# Patient Record
Sex: Female | Born: 1995 | Race: Black or African American | Hispanic: No | Marital: Single | Smoking: Former smoker
Health system: Southern US, Community
[De-identification: ages and names within clinical notes are randomized; demographics above are authoritative.]

## PROBLEM LIST (undated history)

## (undated) DIAGNOSIS — F909 Attention-deficit hyperactivity disorder, unspecified type: Secondary | ICD-10-CM

## (undated) DIAGNOSIS — F32A Depression, unspecified: Secondary | ICD-10-CM

---
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
## 2016-03-31 ENCOUNTER — Emergency Department
Admission: EM | Admit: 2016-03-31 | Discharge: 2016-03-31 | Disposition: A | Discharge status: Home / Self Care | Payer: Self-pay | Attending: Emergency Medicine | Admitting: Emergency Medicine

## 2016-03-31 ENCOUNTER — Emergency Department: Payer: Self-pay

## 2016-03-31 ENCOUNTER — Encounter: Payer: Self-pay | Admitting: *Deleted

## 2016-03-31 DIAGNOSIS — B3731 Acute candidiasis of vulva and vagina: Secondary | ICD-10-CM

## 2016-03-31 DIAGNOSIS — R1030 Lower abdominal pain, unspecified: Secondary | ICD-10-CM

## 2016-03-31 DIAGNOSIS — B379 Candidiasis, unspecified: Secondary | ICD-10-CM

## 2016-03-31 DIAGNOSIS — F17210 Nicotine dependence, cigarettes, uncomplicated: Secondary | ICD-10-CM | POA: Insufficient documentation

## 2016-03-31 DIAGNOSIS — B373 Candidiasis of vulva and vagina: Secondary | ICD-10-CM | POA: Insufficient documentation

## 2016-03-31 LAB — CBC
HEMATOCRIT: 39.6 % (ref 35.0–47.0)
HEMOGLOBIN: 12.3 g/dL (ref 12.0–16.0)
MCH: 23.5 pg — AB (ref 26.0–34.0)
MCHC: 31.2 g/dL — AB (ref 32.0–36.0)
MCV: 75.4 fL — AB (ref 80.0–100.0)
Platelets: 363 10*3/uL (ref 150–440)
RBC: 5.25 MIL/uL — ABNORMAL HIGH (ref 3.80–5.20)
RDW: 16.1 % — ABNORMAL HIGH (ref 11.5–14.5)
WBC: 10 10*3/uL (ref 3.6–11.0)

## 2016-03-31 LAB — WET PREP, GENITAL
CLUE CELLS WET PREP: NONE SEEN
Sperm: NONE SEEN
TRICH WET PREP: NONE SEEN

## 2016-03-31 LAB — URINALYSIS COMPLETE WITH MICROSCOPIC (ARMC ONLY)
BACTERIA UA: NONE SEEN
Bilirubin Urine: NEGATIVE
Glucose, UA: NEGATIVE mg/dL
Hgb urine dipstick: NEGATIVE
Ketones, ur: NEGATIVE mg/dL
Nitrite: NEGATIVE
PROTEIN: 30 mg/dL — AB
Specific Gravity, Urine: 1.028 (ref 1.005–1.030)
pH: 5 (ref 5.0–8.0)

## 2016-03-31 LAB — COMPREHENSIVE METABOLIC PANEL
ALBUMIN: 4.7 g/dL (ref 3.5–5.0)
ALK PHOS: 77 U/L (ref 38–126)
ALT: 15 U/L (ref 14–54)
ANION GAP: 10 (ref 5–15)
AST: 24 U/L (ref 15–41)
BUN: 8 mg/dL (ref 6–20)
CALCIUM: 9.6 mg/dL (ref 8.9–10.3)
CO2: 21 mmol/L — AB (ref 22–32)
Chloride: 109 mmol/L (ref 101–111)
Creatinine, Ser: 0.73 mg/dL (ref 0.44–1.00)
GFR calc Af Amer: >60 mL/min (ref >60)
GFR calc non Af Amer: >60 mL/min (ref >60)
GLUCOSE: 125 mg/dL — AB (ref 65–99)
POTASSIUM: 3.8 mmol/L (ref 3.5–5.1)
SODIUM: 140 mmol/L (ref 135–145)
Total Bilirubin: 0.3 mg/dL (ref 0.3–1.2)
Total Protein: 8.4 g/dL — ABNORMAL HIGH (ref 6.5–8.1)

## 2016-03-31 LAB — CHLAMYDIA/NGC RT PCR (ARMC ONLY)
CHLAMYDIA TR: NOT DETECTED
N gonorrhoeae: NOT DETECTED

## 2016-03-31 LAB — PREGNANCY, URINE: PREG TEST UR: NEGATIVE

## 2016-03-31 LAB — LIPASE, BLOOD: Lipase: 16 U/L (ref 11–51)

## 2016-03-31 MED ORDER — LOPERAMIDE HCL 2 MG PO CAPS
2.0000 mg | ORAL_CAPSULE | Freq: Once | ORAL | Status: AC
  Administered 2016-03-31: 2 mg via ORAL
  Filled 2016-03-31: qty 1

## 2016-03-31 MED ORDER — FLUCONAZOLE 50 MG PO TABS
150.0000 mg | ORAL_TABLET | Freq: Once | ORAL | Status: AC
  Administered 2016-03-31: 150 mg via ORAL
  Filled 2016-03-31: qty 1

## 2016-03-31 MED ORDER — SODIUM CHLORIDE 0.9 % IV BOLUS (SEPSIS)
1000.0000 mL | Freq: Once | INTRAVENOUS | Status: AC
  Administered 2016-03-31: 1000 mL via INTRAVENOUS

## 2016-03-31 MED ORDER — ONDANSETRON HCL 4 MG/2ML IJ SOLN
4.0000 mg | Freq: Once | INTRAMUSCULAR | Status: AC
  Administered 2016-03-31: 4 mg via INTRAVENOUS
  Filled 2016-03-31: qty 2
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
## 2016-03-31 NOTE — Discharge Instructions (Signed)
Abdominal Pain, Adult Many things can cause abdominal pain. Usually, abdominal pain is not caused by a disease and will improve without treatment. It can often be observed and treated at home. Your health care provider will do a physical exam and possibly order blood tests and X-rays to help determine the seriousness of your pain. However, in many cases, more time must pass before a clear cause of the pain can be found. Before that point, your health care provider may not know if you need more testing or further treatment. HOME CARE INSTRUCTIONS Monitor your abdominal pain for any changes. The following actions may help to alleviate any discomfort you are experiencing:  Only take over-the-counter or prescription medicines as directed by your health care provider.  Do not take laxatives unless directed to do so by your health care provider.  Try a clear liquid diet (broth, tea, or water) as directed by your health care provider. Slowly move to a bland diet as tolerated. SEEK MEDICAL CARE IF:  You have unexplained abdominal pain.  You have abdominal pain associated with nausea or diarrhea.  You have pain when you urinate or have a bowel movement.  You experience abdominal pain that wakes you in the night.  You have abdominal pain that is worsened or improved by eating food.  You have abdominal pain that is worsened with eating fatty foods.  You have a fever. SEEK IMMEDIATE MEDICAL CARE IF:  Your pain does not go away within 2 hours.  You keep throwing up (vomiting).  Your pain is felt only in portions of the abdomen, such as the right side or the left lower portion of the abdomen.  You pass bloody or black tarry stools. MAKE SURE YOU:  Understand these instructions.  Will watch your condition.  Will get help right away if you are not doing well or get worse.   This information is not intended to replace advice given to you by your health care provider. Make sure you discuss  any questions you have with your health care provider.   Document Released: 07/11/2005 Document Revised: 06/22/2015 Document Reviewed: 06/10/2013 Elsevier Interactive Patient Education 2016 Elsevier Inc.  Monilial Vaginitis Vaginitis in a soreness, swelling and redness (inflammation) of the vagina and vulva. Monilial vaginitis is not a sexually transmitted infection. CAUSES  Yeast vaginitis is caused by yeast (candida) that is normally found in your vagina. With a yeast infection, the candida has overgrown in number to a point that upsets the chemical balance. SYMPTOMS   White, thick vaginal discharge.  Swelling, itching, redness and irritation of the vagina and possibly the lips of the vagina (vulva).  Burning or painful urination.  Painful intercourse. DIAGNOSIS  Things that may contribute to monilial vaginitis are:  Postmenopausal and virginal states.  Pregnancy.  Infections.  Being tired, sick or stressed, especially if you had monilial vaginitis in the past.  Diabetes. Good control will help lower the chance.  Birth control pills.  Tight fitting garments.  Using bubble bath, feminine sprays, douches or deodorant tampons.  Taking certain medications that kill germs (antibiotics).  Sporadic recurrence can occur if you become ill. TREATMENT  Your caregiver will give you medication.  There are several kinds of anti monilial vaginal creams and suppositories specific for monilial vaginitis. For recurrent yeast infections, use a suppository or cream in the vagina 2 times a week, or as directed.  Anti-monilial or steroid cream for the itching or irritation of the vulva may also be used. Get  your caregiver's permission. °· Painting the vagina with methylene blue solution may help if the monilial cream does not work. °· Eating yogurt may help prevent monilial vaginitis. °HOME CARE INSTRUCTIONS  °· Finish all medication as prescribed. °· Do not have sex until treatment is  completed or after your caregiver tells you it is okay. °· Take warm sitz baths. °· Do not douche. °· Do not use tampons, especially scented ones. °· Wear cotton underwear. °· Avoid tight pants and panty hose. °· Tell your sexual partner that you have a yeast infection. They should go to their caregiver if they have symptoms such as mild rash or itching. °· Your sexual partner should be treated as well if your infection is difficult to eliminate. °· Practice safer sex. Use condoms. °· Some vaginal medications cause latex condoms to fail. Vaginal medications that harm condoms are: °¨ Cleocin cream. °¨ Butoconazole (Femstat®). °¨ Terconazole (Terazol®) vaginal suppository. °¨ Miconazole (Monistat®) (may be purchased over the counter). °SEEK MEDICAL CARE IF:  °· You have a temperature by mouth above 102° F (38.9° C). °· The infection is getting worse after 2 days of treatment. °· The infection is not getting better after 3 days of treatment. °· You develop blisters in or around your vagina. °· You develop vaginal bleeding, and it is not your menstrual period. °· You have pain when you urinate. °· You develop intestinal problems. °· You have pain with sexual intercourse. °  °This information is not intended to replace advice given to you by your health care provider. Make sure you discuss any questions you have with your health care provider. °  °Document Released: 07/11/2005 Document Revised: 12/24/2011 Document Reviewed: 04/04/2015 °Elsevier Interactive Patient Education ©2016 Elsevier Inc. ° °
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
## 2017-04-06 ENCOUNTER — Emergency Department (HOSPITAL_COMMUNITY)
Admission: EM | Admit: 2017-04-06 | Discharge: 2017-04-06 | Disposition: A | Discharge status: Home / Self Care | Payer: Self-pay

## 2017-04-06 NOTE — ED Triage Notes (Signed)
No answer in waiting.  

## 2017-04-06 NOTE — ED Triage Notes (Signed)
No answer x3

## 2017-04-07 ENCOUNTER — Emergency Department (HOSPITAL_COMMUNITY): Payer: Self-pay

## 2017-04-07 ENCOUNTER — Encounter (HOSPITAL_COMMUNITY): Payer: Self-pay | Admitting: *Deleted

## 2017-04-07 ENCOUNTER — Emergency Department (HOSPITAL_COMMUNITY)
Admission: EM | Admit: 2017-04-07 | Discharge: 2017-04-07 | Disposition: A | Discharge status: Home / Self Care | Payer: Self-pay | Attending: Emergency Medicine | Admitting: Emergency Medicine

## 2017-04-07 DIAGNOSIS — J069 Acute upper respiratory infection, unspecified: Secondary | ICD-10-CM | POA: Insufficient documentation

## 2017-04-07 DIAGNOSIS — F17210 Nicotine dependence, cigarettes, uncomplicated: Secondary | ICD-10-CM | POA: Insufficient documentation

## 2017-04-07 DIAGNOSIS — R1110 Vomiting, unspecified: Secondary | ICD-10-CM | POA: Insufficient documentation

## 2017-04-07 LAB — RAPID STREP SCREEN (MED CTR MEBANE ONLY): STREPTOCOCCUS, GROUP A SCREEN (DIRECT): NEGATIVE

## 2017-04-07 MED ORDER — GUAIFENESIN 100 MG/5ML PO SOLN
5.0000 mL | Freq: Once | ORAL | Status: AC
  Administered 2017-04-07: 100 mg via ORAL
  Filled 2017-04-07: qty 5

## 2017-04-07 MED ORDER — BENZONATATE 100 MG PO CAPS: 100.0000 mg | ORAL_CAPSULE | Freq: Three times a day (TID) | ORAL | 0 refills | Status: DC

## 2017-04-07 NOTE — ED Provider Notes (Signed)
[facility]-EMERGENCY DEPT Provider Note   CSN: [id number] Arrival date & time: 04/07/17  1351     History   Chief Complaint Chief Complaint  Patient presents with  . URI  . Emesis    HPI Susan Graves is a 21 y.o. female.  Patient is a 21 year old female with no cerumen past medical history who presents with cough and cold symptoms. She states for the last 3 days she's had runny nose congestion and coughing. She's coughing up some yellow-green sputum. She denies any shortness of breath. She has a little bit of pain in the center of her chest only with coughing. She's had some posttussive emesis but no other vomiting. She also has a sore throat. No known fevers but she has had some chills. She also has myalgias and a bifrontal-type headache. She's been using Alka-Seltzer and Tylenol without significant improvement in symptoms.      History reviewed. No pertinent past medical history.  There are no active problems to display for this patient.   History reviewed. No pertinent surgical history.  OB History    No data available       Home Medications    Prior to Admission medications   Medication Sig Start Date End Date Taking? Authorizing Provider  acetaminophen (TYLENOL) 650 MG CR tablet Take 650 mg by mouth every 8 (eight) hours as needed for pain or fever.   Yes [provider]  benzonatate (TESSALON) 100 MG capsule Take 1 capsule (100 mg total) by mouth every 8 (eight) hours. 04/07/17   Rolan Bucco, MD    Family History No family history on file.  Social History Social History  Substance Use Topics  . Smoking status: Current Every Day Smoker    Packs/day: 0.50    Types: Cigarettes  . Smokeless tobacco: Never Used  . Alcohol use No     Allergies   Patient has no known allergies.   Review of Systems Review of Systems  Constitutional: Positive for appetite change, chills and fatigue. Negative for diaphoresis and fever.  HENT: Positive for  congestion, postnasal drip, rhinorrhea and sore throat. Negative for sneezing, trouble swallowing and voice change.   Eyes: Negative.   Respiratory: Positive for cough. Negative for chest tightness and shortness of breath.   Cardiovascular: Positive for chest pain. Negative for leg swelling.  Gastrointestinal: Positive for vomiting. Negative for abdominal pain, blood in stool, diarrhea and nausea.  Genitourinary: Negative for difficulty urinating, flank pain, frequency and hematuria.  Musculoskeletal: Positive for myalgias. Negative for arthralgias and back pain.  Skin: Negative for rash.  Neurological: Positive for headaches. Negative for dizziness, speech difficulty, weakness and numbness.     Physical Exam Updated Vital Signs BP 104/66   Pulse 95   Temp 98.9 F (37.2 C) (Oral)   Resp 18   Ht 5\' 6"  (1.676 m)   Wt 67.6 kg (149 lb)   LMP 03/15/2017   SpO2 100%   BMI 24.05 kg/m   Physical Exam  Constitutional: She is oriented to person, place, and time. She appears well-developed and well-nourished.  HENT:  Head: Normocephalic and atraumatic.  Right Ear: External ear normal.  Left Ear: External ear normal.  Mouth/Throat: Oropharynx is clear and moist. No oropharyngeal exudate.  Eyes: Pupils are equal, round, and reactive to light.  Neck: Normal range of motion. Neck supple.  No meningismus  Cardiovascular: Normal rate, regular rhythm and normal heart sounds.   Pulmonary/Chest: Effort normal and breath sounds normal. No respiratory distress.  She has no wheezes. She has no rales. She exhibits no tenderness.  Abdominal: Soft. Bowel sounds are normal. There is no tenderness. There is no rebound and no guarding.  Musculoskeletal: Normal range of motion. She exhibits no edema.  Lymphadenopathy:    She has no cervical adenopathy.  Neurological: She is alert and oriented to person, place, and time.  Skin: Skin is warm and dry. No rash noted.  Psychiatric: She has a normal mood and  affect.     ED Treatments / Results  Labs (all labs ordered are listed, but only abnormal results are displayed) Labs Reviewed  RAPID STREP SCREEN (NOT AT [facility])  CULTURE, GROUP A STREP [facility])    EKG  EKG Interpretation None       Radiology Dg Chest 2 View  Result Date: 04/07/2017 CLINICAL DATA:  Pt c/o fever, body aches, SOB, sore throat, headaches, tightness in her chest and a productive cough of green/yellow mucus x 3 days. EXAM: CHEST  2 VIEW COMPARISON:  03/04/2009 FINDINGS: The heart size and mediastinal contours are within normal limits. Both lungs are clear. No pleural effusion or pneumothorax. The visualized skeletal structures are unremarkable. IMPRESSION: Normal chest radiographs. Electronically Signed   By: Amie Portlandavid  Ormond M.D.   On: 04/07/2017 16:54    Procedures Procedures (including critical care time)  Medications Ordered in ED Medications  guaiFENesin (ROBITUSSIN) 100 MG/5ML solution 100 mg (100 mg Oral Given 04/07/17 1625)     Initial Impression / Assessment and Plan / ED Course  I have reviewed the triage vital signs and the nursing notes.  Pertinent labs & imaging results that were available during my care of the patient were reviewed by me and considered in my medical decision making (see chart for details).     Patient presents with URI symptoms. Her strep test is negative. Her chest x-ray is clear without evidence of pneumonia. She's otherwise well-appearing with no hypoxia. She was discharged home in good condition. She was given a prescription for Tessalon pearls. She was also advised to use Mucinex and ibuprofen for symptomatic relief Return precautions were given.  Final Clinical Impressions(s) / ED Diagnoses   Final diagnoses:  Viral upper respiratory tract infection    New Prescriptions New Prescriptions   BENZONATATE (TESSALON) 100 MG CAPSULE    Take 1 capsule (100 mg total) by mouth every 8 (eight) hours.     Rolan BuccoBelfi, Jadyn Barge,  MD 04/07/17 [phone]

## 2017-04-07 NOTE — ED Triage Notes (Signed)
PT states sore throat, rhinitis, chest pain with cough and emesis x 5.

## 2017-04-08 MED FILL — BENZONATATE 100 MG CAP: 100 | 7 days supply | Qty: 21 | Fill #0

## 2017-04-09 LAB — CULTURE, GROUP A STREP (THRC)

## 2017-04-11 ENCOUNTER — Emergency Department (HOSPITAL_COMMUNITY)
Admission: EM | Admit: 2017-04-11 | Discharge: 2017-04-11 | Disposition: A | Discharge status: Home / Self Care | Payer: Self-pay | Attending: Emergency Medicine | Admitting: Emergency Medicine

## 2017-04-11 ENCOUNTER — Encounter (HOSPITAL_COMMUNITY): Payer: Self-pay

## 2017-04-11 DIAGNOSIS — B001 Herpesviral vesicular dermatitis: Secondary | ICD-10-CM | POA: Insufficient documentation

## 2017-04-11 DIAGNOSIS — F17210 Nicotine dependence, cigarettes, uncomplicated: Secondary | ICD-10-CM | POA: Insufficient documentation

## 2017-04-11 DIAGNOSIS — Z79899 Other long term (current) drug therapy: Secondary | ICD-10-CM | POA: Insufficient documentation

## 2017-04-11 MED ORDER — ACYCLOVIR 400 MG PO TABS: 400.0000 mg | ORAL_TABLET | Freq: Three times a day (TID) | ORAL | 0 refills | Status: DC

## 2017-04-11 NOTE — Discharge Instructions (Signed)
Read the information below.  Use the prescribed medication as directed.  Please discuss all new medications with your pharmacist.  You may return to the Emergency Department at any time for worsening condition or any new symptoms that concern you.    °
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
## 2017-04-11 NOTE — ED Triage Notes (Addendum)
Pt states that partner was dx'd with herpes today and she wants to know if she has it as well. She reports cold symptoms, but no other issues. A&Ox4. Denies vaginal d/c, bumps or pain. Ambulatory.

## 2017-04-11 NOTE — Congregational Nurse Program (Signed)
Congregational Nurse Program Note  Date of Encounter: 04/08/2017  Past Medical History: No past medical history on file.  Encounter Details:     CNP Questionnaire - 04/08/17 1620      Patient Demographics   Is this a new or existing patient? New   Patient is considered a/an Not Applicable   Race American Indian/[place] Native     Patient Assistance   Location of Patient Assistance Not Applicable   Patient's financial/insurance status Low Income;Self-Pay (Uninsured)   Patient referred to apply for the following financial assistance Not Applicable   Food insecurities addressed Not Applicable   Transportation assistance No   Assistance securing medications Yes   Type of Assistance [facility]   Educational health offerings Acute disease;Medications     Encounter Details   Primary purpose of visit Acute Illness/Condition Visit;Post ED/Hospitalization Visit   Was an Emergency Department visit averted? Not Applicable   Does patient have a medical provider? No   Patient referred to Clinic   Was a mental health screening completed? (GAINS tool) No   Does patient have dental issues? No   Does patient have vision issues? No   Does your patient have an abnormal blood pressure today? No   Since previous encounter, have you referred patient for abnormal blood pressure that resulted in a new diagnosis or medication change? No   Does your patient have an abnormal blood glucose today? No   Since previous encounter, have you referred patient for abnormal blood glucose that resulted in a new diagnosis or medication change? No   Was there a life-saving intervention made? No     Requested assistance with obtaining medication for cough filled (script written in the ED).  Medication filled and given to client

## 2017-04-12 LAB — HSV 2 ANTIBODY, IGG

## 2017-04-12 LAB — HSV 1 ANTIBODY, IGG: HSV 1 GLYCOPROTEIN G AB, IGG: 61.7 {index} — AB (ref 0.00–0.90)

## 2017-04-15 ENCOUNTER — Encounter: Payer: Self-pay | Admitting: Pediatric Intensive Care

## 2017-04-15 MED FILL — ACYCLOVIR 400 MG TABLET: 400 | 7 days supply | Qty: 21 | Fill #0

## 2017-05-22 ENCOUNTER — Encounter: Payer: Self-pay | Admitting: Pediatric Intensive Care

## 2017-05-22 NOTE — Congregational Nurse Program (Signed)
Congregational Nurse Program Note  Date of Encounter: 04/15/2017  Past Medical History: No past medical history on file.  Encounter Details: Client has prescription for zovirax she needs filled. CN [facility]. CN advised client to [facility] post-ED visit.

## 2017-05-26 ENCOUNTER — Emergency Department
Admission: EM | Admit: 2017-05-26 | Discharge: 2017-05-26 | Disposition: A | Discharge status: Home / Self Care | Payer: Self-pay | Attending: Emergency Medicine | Admitting: Emergency Medicine

## 2017-05-26 ENCOUNTER — Encounter: Payer: Self-pay | Admitting: Emergency Medicine

## 2017-05-26 DIAGNOSIS — Z23 Encounter for immunization: Secondary | ICD-10-CM | POA: Insufficient documentation

## 2017-05-26 DIAGNOSIS — Y998 Other external cause status: Secondary | ICD-10-CM | POA: Insufficient documentation

## 2017-05-26 DIAGNOSIS — F329 Major depressive disorder, single episode, unspecified: Secondary | ICD-10-CM | POA: Insufficient documentation

## 2017-05-26 DIAGNOSIS — F17210 Nicotine dependence, cigarettes, uncomplicated: Secondary | ICD-10-CM | POA: Insufficient documentation

## 2017-05-26 DIAGNOSIS — F32A Depression, unspecified: Secondary | ICD-10-CM

## 2017-05-26 DIAGNOSIS — Y9389 Activity, other specified: Secondary | ICD-10-CM | POA: Insufficient documentation

## 2017-05-26 DIAGNOSIS — X788XXA Intentional self-harm by other sharp object, initial encounter: Secondary | ICD-10-CM | POA: Insufficient documentation

## 2017-05-26 DIAGNOSIS — Y929 Unspecified place or not applicable: Secondary | ICD-10-CM | POA: Insufficient documentation

## 2017-05-26 DIAGNOSIS — S41112A Laceration without foreign body of left upper arm, initial encounter: Secondary | ICD-10-CM | POA: Insufficient documentation

## 2017-05-26 HISTORY — DX: Attention-deficit hyperactivity disorder, unspecified type: F90.9

## 2017-05-26 LAB — CBC WITH DIFFERENTIAL/PLATELET
Basophils Absolute: 0.1 10*3/uL (ref 0–0.1)
Basophils Relative: 1 %
Eosinophils Absolute: 0 10*3/uL (ref 0–0.7)
Eosinophils Relative: 0 %
HCT: 33.7 % — ABNORMAL LOW (ref 35.0–47.0)
HEMOGLOBIN: 10.8 g/dL — AB (ref 12.0–16.0)
LYMPHS ABS: 1.2 10*3/uL (ref 1.0–3.6)
LYMPHS PCT: 20 %
MCH: 21.8 pg — AB (ref 26.0–34.0)
MCHC: 32.1 g/dL (ref 32.0–36.0)
MCV: 67.7 fL — AB (ref 80.0–100.0)
MONOS PCT: 8 %
Monocytes Absolute: 0.5 10*3/uL (ref 0.2–0.9)
NEUTROS PCT: 71 %
Neutro Abs: 4.5 10*3/uL (ref 1.4–6.5)
PLATELETS: 331 10*3/uL (ref 150–440)
RBC: 4.97 MIL/uL (ref 3.80–5.20)
RDW: 17.5 % — ABNORMAL HIGH (ref 11.5–14.5)
WBC: 6.3 10*3/uL (ref 3.6–11.0)

## 2017-05-26 LAB — COMPREHENSIVE METABOLIC PANEL
ALT: 12 U/L — AB (ref 14–54)
AST: 22 U/L (ref 15–41)
Albumin: 4.9 g/dL (ref 3.5–5.0)
Alkaline Phosphatase: 73 U/L (ref 38–126)
Anion gap: 9 (ref 5–15)
BUN: 11 mg/dL (ref 6–20)
CHLORIDE: 109 mmol/L (ref 101–111)
CO2: 21 mmol/L — AB (ref 22–32)
CREATININE: 0.84 mg/dL (ref 0.44–1.00)
Calcium: 9.6 mg/dL (ref 8.9–10.3)
GFR calc Af Amer: >60 mL/min (ref >60)
Glucose, Bld: 104 mg/dL — ABNORMAL HIGH (ref 65–99)
POTASSIUM: 3.7 mmol/L (ref 3.5–5.1)
SODIUM: 139 mmol/L (ref 135–145)
Total Bilirubin: 0.4 mg/dL (ref 0.3–1.2)
Total Protein: 8.2 g/dL — ABNORMAL HIGH (ref 6.5–8.1)

## 2017-05-26 LAB — URINE DRUG SCREEN, QUALITATIVE (ARMC ONLY)
AMPHETAMINES, UR SCREEN: NOT DETECTED
Barbiturates, Ur Screen: NOT DETECTED
Benzodiazepine, Ur Scrn: NOT DETECTED
CANNABINOID 50 NG, UR ~~LOC~~: NOT DETECTED
COCAINE METABOLITE, UR ~~LOC~~: NOT DETECTED
MDMA (ECSTASY) UR SCREEN: NOT DETECTED
Methadone Scn, Ur: NOT DETECTED
Opiate, Ur Screen: NOT DETECTED
PHENCYCLIDINE (PCP) UR S: NOT DETECTED
TRICYCLIC, UR SCREEN: NOT DETECTED

## 2017-05-26 LAB — ETHANOL

## 2017-05-26 LAB — HCG, QUANTITATIVE, PREGNANCY

## 2017-05-26 MED ORDER — TETANUS-DIPHTH-ACELL PERTUSSIS 5-2.5-18.5 LF-MCG/0.5 IM SUSP
0.5000 mL | Freq: Once | INTRAMUSCULAR | Status: AC
  Administered 2017-05-26: 0.5 mL via INTRAMUSCULAR
  Filled 2017-05-26: qty 0.5

## 2017-05-26 NOTE — ED Notes (Signed)
Pt awaiting SOC to be completed.

## 2017-05-26 NOTE — Discharge Instructions (Signed)
Please keep your wounds clean and dry and make an appointment to establish care with a psychiatrist within the next week. Return to the emergency department sooner for any concerns whatsoever.  It was a pleasure to take care of you today, and thank you for coming to our emergency department.  If you have any questions or concerns before leaving please ask the nurse to grab me and I'm more than happy to go through your aftercare instructions again.  If you were prescribed any opioid pain medication today such as Norco, Vicodin, Percocet, morphine, hydrocodone, or oxycodone please make sure you do not drive when you are taking this medication as it can alter your ability to drive safely.  If you have any concerns once you are home that you are not improving or are in fact getting worse before you can make it to your follow-up appointment, please do not hesitate to call 911 and come back for further evaluation.  Merrily Brittle, MD  Results for orders placed or performed during the hospital encounter of 05/26/17  Comprehensive metabolic panel  Result Value Ref Range   Sodium 139 135 - 145 mmol/L   Potassium 3.7 3.5 - 5.1 mmol/L   Chloride 109 101 - 111 mmol/L   CO2 21 (L) 22 - 32 mmol/L   Glucose, Bld 104 (H) 65 - 99 mg/dL   BUN 11 6 - 20 mg/dL   Creatinine, Ser 1.61 0.44 - 1.00 mg/dL   Calcium 9.6 8.9 - 09.6 mg/dL   Total Protein 8.2 (H) 6.5 - 8.1 g/dL   Albumin 4.9 3.5 - 5.0 g/dL   AST 22 15 - 41 U/L   ALT 12 (L) 14 - 54 U/L   Alkaline Phosphatase 73 38 - 126 U/L   Total Bilirubin 0.4 0.3 - 1.2 mg/dL   GFR calc non Af Amer >60 >60 mL/min   GFR calc Af Amer >60 >60 mL/min   Anion gap 9 5 - 15  Ethanol  Result Value Ref Range   Alcohol, Ethyl (B) <5 <5 mg/dL  Urine Drug Screen, Qualitative  Result Value Ref Range   Tricyclic, Ur Screen NONE DETECTED NONE DETECTED   Amphetamines, Ur Screen NONE DETECTED NONE DETECTED   MDMA (Ecstasy)Ur Screen NONE DETECTED NONE DETECTED   Cocaine  Metabolite,Ur [place] NONE DETECTED NONE DETECTED   Opiate, Ur Screen NONE DETECTED NONE DETECTED   Phencyclidine (PCP) Ur S NONE DETECTED NONE DETECTED   Cannabinoid 50 Ng, Ur [place] NONE DETECTED NONE DETECTED   Barbiturates, Ur Screen NONE DETECTED NONE DETECTED   Benzodiazepine, Ur Scrn NONE DETECTED NONE DETECTED   Methadone Scn, Ur NONE DETECTED NONE DETECTED  CBC with Diff  Result Value Ref Range   WBC 6.3 3.6 - 11.0 K/uL   RBC 4.97 3.80 - 5.20 MIL/uL   Hemoglobin 10.8 (L) 12.0 - 16.0 g/dL   HCT 04.5 (L) 40.9 - 81.1 %   MCV 67.7 (L) 80.0 - 100.0 fL   MCH 21.8 (L) 26.0 - 34.0 pg   MCHC 32.1 32.0 - 36.0 g/dL   RDW 91.4 (H) 78.2 - 95.6 %   Platelets 331 150 - 440 K/uL   Neutrophils Relative % 71 %   Neutro Abs 4.5 1.4 - 6.5 K/uL   Lymphocytes Relative 20 %   Lymphs Abs 1.2 1.0 - 3.6 K/uL   Monocytes Relative 8 %   Monocytes Absolute 0.5 0.2 - 0.9 K/uL   Eosinophils Relative 0 %   Eosinophils Absolute 0.0  0 - 0.7 K/uL   Basophils Relative 1 %   Basophils Absolute 0.1 0 - 0.1 K/uL  hCG, quantitative, pregnancy  Result Value Ref Range   hCG, Beta Chain, Quant, S <1 <5 mIU/mL
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
## 2017-05-26 NOTE — ED Notes (Signed)
Report given to SOC MD.  
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
## 2017-05-26 NOTE — ED Notes (Signed)
poc negative - unable to download results in triage.
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
## 2017-05-26 NOTE — ED Notes (Signed)
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
## 2017-05-26 NOTE — ED Notes (Signed)
Pt given supper tray. Pt is lying in bed waiting on SOC.

## 2017-12-11 ENCOUNTER — Other Ambulatory Visit: Payer: Self-pay

## 2017-12-11 ENCOUNTER — Emergency Department
Admission: EM | Admit: 2017-12-11 | Discharge: 2017-12-11 | Disposition: A | Discharge status: Home / Self Care | Payer: Self-pay | Attending: Emergency Medicine | Admitting: Emergency Medicine

## 2017-12-11 DIAGNOSIS — Y9389 Activity, other specified: Secondary | ICD-10-CM | POA: Insufficient documentation

## 2017-12-11 DIAGNOSIS — Y929 Unspecified place or not applicable: Secondary | ICD-10-CM | POA: Insufficient documentation

## 2017-12-11 DIAGNOSIS — Y999 Unspecified external cause status: Secondary | ICD-10-CM | POA: Insufficient documentation

## 2017-12-11 DIAGNOSIS — W208XXA Other cause of strike by thrown, projected or falling object, initial encounter: Secondary | ICD-10-CM | POA: Insufficient documentation

## 2017-12-11 DIAGNOSIS — F17210 Nicotine dependence, cigarettes, uncomplicated: Secondary | ICD-10-CM | POA: Insufficient documentation

## 2017-12-11 DIAGNOSIS — S0181XA Laceration without foreign body of other part of head, initial encounter: Secondary | ICD-10-CM | POA: Insufficient documentation

## 2017-12-11 MED ORDER — LIDOCAINE HCL (PF) 1 % IJ SOLN
10.0000 mL | Freq: Once | INTRAMUSCULAR | Status: AC
  Administered 2017-12-11: 10 mL
  Filled 2017-12-11: qty 10
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
## 2018-09-15 ENCOUNTER — Emergency Department
Admission: EM | Admit: 2018-09-15 | Discharge: 2018-09-15 | Disposition: A | Discharge status: Home / Self Care | Payer: Self-pay | Attending: Emergency Medicine | Admitting: Emergency Medicine

## 2018-09-15 ENCOUNTER — Encounter: Payer: Self-pay | Admitting: Emergency Medicine

## 2018-09-15 DIAGNOSIS — Y939 Activity, unspecified: Secondary | ICD-10-CM | POA: Insufficient documentation

## 2018-09-15 DIAGNOSIS — Y929 Unspecified place or not applicable: Secondary | ICD-10-CM | POA: Insufficient documentation

## 2018-09-15 DIAGNOSIS — S01511A Laceration without foreign body of lip, initial encounter: Secondary | ICD-10-CM | POA: Insufficient documentation

## 2018-09-15 DIAGNOSIS — Y999 Unspecified external cause status: Secondary | ICD-10-CM | POA: Insufficient documentation
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
## 2018-09-15 NOTE — ED Triage Notes (Signed)
Pt in [facility] custody, lac to upper lip. Tetanus UTD.
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
## 2018-09-15 NOTE — ED Notes (Signed)

## 2018-09-15 NOTE — Discharge Instructions (Addendum)
Follow Dermabond discharge care instructions.  No creams or ointment to area.

## 2018-12-03 DIAGNOSIS — IMO0002 Reserved for concepts with insufficient information to code with codable children: Secondary | ICD-10-CM | POA: Insufficient documentation

## 2019-03-31 LAB — HM PAP SMEAR

## 2019-03-31 LAB — HM HIV SCREENING LAB: HM HIV Screening: NEGATIVE

## 2019-12-04 ENCOUNTER — Emergency Department (HOSPITAL_COMMUNITY): Payer: 59

## 2019-12-04 ENCOUNTER — Observation Stay (HOSPITAL_COMMUNITY)
Admission: EM | Admit: 2019-12-04 | Discharge: 2019-12-05 | DRG: 203 | Disposition: A | Discharge status: Home / Self Care | Payer: 59 | Attending: Internal Medicine | Admitting: Internal Medicine

## 2019-12-04 ENCOUNTER — Encounter (HOSPITAL_COMMUNITY): Payer: Self-pay | Admitting: Emergency Medicine

## 2019-12-04 DIAGNOSIS — I959 Hypotension, unspecified: Secondary | ICD-10-CM | POA: Diagnosis not present

## 2019-12-04 DIAGNOSIS — E876 Hypokalemia: Secondary | ICD-10-CM | POA: Diagnosis not present

## 2019-12-04 DIAGNOSIS — R000 Tachycardia, unspecified: Secondary | ICD-10-CM | POA: Diagnosis not present

## 2019-12-04 DIAGNOSIS — F17290 Nicotine dependence, other tobacco product, uncomplicated: Secondary | ICD-10-CM | POA: Diagnosis present

## 2019-12-04 DIAGNOSIS — J40 Bronchitis, not specified as acute or chronic: Secondary | ICD-10-CM | POA: Diagnosis not present

## 2019-12-04 DIAGNOSIS — U071 COVID-19: Secondary | ICD-10-CM

## 2019-12-04 DIAGNOSIS — F17210 Nicotine dependence, cigarettes, uncomplicated: Secondary | ICD-10-CM | POA: Diagnosis not present

## 2019-12-04 DIAGNOSIS — N289 Disorder of kidney and ureter, unspecified: Secondary | ICD-10-CM | POA: Diagnosis present

## 2019-12-04 DIAGNOSIS — Z20822 Contact with and (suspected) exposure to covid-19: Secondary | ICD-10-CM | POA: Diagnosis present

## 2019-12-04 DIAGNOSIS — D649 Anemia, unspecified: Secondary | ICD-10-CM

## 2019-12-04 DIAGNOSIS — K649 Unspecified hemorrhoids: Secondary | ICD-10-CM | POA: Diagnosis present

## 2019-12-04 DIAGNOSIS — A419 Sepsis, unspecified organism: Secondary | ICD-10-CM | POA: Diagnosis present

## 2019-12-04 DIAGNOSIS — F909 Attention-deficit hyperactivity disorder, unspecified type: Secondary | ICD-10-CM | POA: Diagnosis present

## 2019-12-04 DIAGNOSIS — L299 Pruritus, unspecified: Secondary | ICD-10-CM | POA: Diagnosis not present

## 2019-12-04 LAB — CBC WITH DIFFERENTIAL/PLATELET
Abs Immature Granulocytes: 0.02 10*3/uL (ref 0.00–0.07)
Abs Immature Granulocytes: 0.04 10*3/uL (ref 0.00–0.07)
Basophils Absolute: 0 10*3/uL (ref 0.0–0.1)
Basophils Absolute: 0 10*3/uL (ref 0.0–0.1)
Basophils Relative: 0 %
Basophils Relative: 0 %
Eosinophils Absolute: 0 10*3/uL (ref 0.0–0.5)
Eosinophils Absolute: 0 10*3/uL (ref 0.0–0.5)
Eosinophils Relative: 0 %
Eosinophils Relative: 0 %
HCT: 33.7 % — ABNORMAL LOW (ref 36.0–46.0)
HCT: 33 % — ABNORMAL LOW (ref 36.0–46.0)
Hemoglobin: 10.6 g/dL — ABNORMAL LOW (ref 12.0–15.0)
Hemoglobin: 10.2 g/dL — ABNORMAL LOW (ref 12.0–15.0)
Immature Granulocytes: 0 %
Immature Granulocytes: 0 %
Lymphocytes Relative: 10 %
Lymphocytes Relative: 6 %
Lymphs Abs: 1 10*3/uL (ref 0.7–4.0)
Lymphs Abs: 0.7 10*3/uL (ref 0.7–4.0)
MCH: 25.2 pg — ABNORMAL LOW (ref 26.0–34.0)
MCH: 24.9 pg — ABNORMAL LOW (ref 26.0–34.0)
MCHC: 31.5 g/dL (ref 30.0–36.0)
MCHC: 30.9 g/dL (ref 30.0–36.0)
MCV: 80 fL (ref 80.0–100.0)
MCV: 80.5 fL (ref 80.0–100.0)
Monocytes Absolute: 0.5 10*3/uL (ref 0.1–1.0)
Monocytes Absolute: 0.4 10*3/uL (ref 0.1–1.0)
Monocytes Relative: 5 %
Monocytes Relative: 4 %
Neutro Abs: 8.1 10*3/uL — ABNORMAL HIGH (ref 1.7–7.7)
Neutro Abs: 9.8 10*3/uL — ABNORMAL HIGH (ref 1.7–7.7)
Neutrophils Relative %: 85 %
Neutrophils Relative %: 90 %
Platelets: 267 10*3/uL (ref 150–400)
Platelets: 253 10*3/uL (ref 150–400)
RBC: 4.21 MIL/uL (ref 3.87–5.11)
RBC: 4.1 MIL/uL (ref 3.87–5.11)
RDW: 14.4 % (ref 11.5–15.5)
RDW: 14.1 % (ref 11.5–15.5)
WBC: 9.6 10*3/uL (ref 4.0–10.5)
WBC: 11 10*3/uL — ABNORMAL HIGH (ref 4.0–10.5)
nRBC: 0 % (ref 0.0–0.2)
nRBC: 0 % (ref 0.0–0.2)

## 2019-12-04 LAB — COMPREHENSIVE METABOLIC PANEL
ALT: 17 U/L (ref 0–44)
ALT: 16 U/L (ref 0–44)
AST: 30 U/L (ref 15–41)
AST: 28 U/L (ref 15–41)
Albumin: 3.8 g/dL (ref 3.5–5.0)
Albumin: 3.7 g/dL (ref 3.5–5.0)
Alkaline Phosphatase: 57 U/L (ref 38–126)
Alkaline Phosphatase: 54 U/L (ref 38–126)
Anion gap: 14 (ref 5–15)
Anion gap: 11 (ref 5–15)
BUN: 7 mg/dL (ref 6–20)
BUN: 6 mg/dL (ref 6–20)
CO2: 22 mmol/L (ref 22–32)
CO2: 22 mmol/L (ref 22–32)
Calcium: 8.6 mg/dL — ABNORMAL LOW (ref 8.9–10.3)
Calcium: 8.3 mg/dL — ABNORMAL LOW (ref 8.9–10.3)
Chloride: 98 mmol/L (ref 98–111)
Chloride: 100 mmol/L (ref 98–111)
Creatinine, Ser: 1.03 mg/dL — ABNORMAL HIGH (ref 0.44–1.00)
Creatinine, Ser: 0.9 mg/dL (ref 0.44–1.00)
GFR calc Af Amer: >60 mL/min (ref >60)
GFR calc Af Amer: >60 mL/min (ref >60)
GFR calc non Af Amer: >60 mL/min (ref >60)
GFR calc non Af Amer: >60 mL/min (ref >60)
Glucose, Bld: 137 mg/dL — ABNORMAL HIGH (ref 70–99)
Glucose, Bld: 130 mg/dL — ABNORMAL HIGH (ref 70–99)
Potassium: 3.1 mmol/L — ABNORMAL LOW (ref 3.5–5.1)
Potassium: 3.2 mmol/L — ABNORMAL LOW (ref 3.5–5.1)
Sodium: 134 mmol/L — ABNORMAL LOW (ref 135–145)
Sodium: 133 mmol/L — ABNORMAL LOW (ref 135–145)
Total Bilirubin: 0.4 mg/dL (ref 0.3–1.2)
Total Bilirubin: 0.4 mg/dL (ref 0.3–1.2)
Total Protein: 7.1 g/dL (ref 6.5–8.1)
Total Protein: 6.8 g/dL (ref 6.5–8.1)

## 2019-12-04 LAB — RESPIRATORY PANEL BY PCR

## 2019-12-04 LAB — I-STAT BETA HCG BLOOD, ED (MC, WL, AP ONLY)
I-stat hCG, quantitative: <5 m[IU]/mL (ref <5)
I-stat hCG, quantitative: <5 m[IU]/mL (ref <5)

## 2019-12-04 LAB — LACTIC ACID, PLASMA
Lactic Acid, Venous: 1.4 mmol/L (ref 0.5–1.9)
Lactic Acid, Venous: 2.5 mmol/L (ref 0.5–1.9)

## 2019-12-04 LAB — FERRITIN: Ferritin: 17 ng/mL (ref 11–307)

## 2019-12-04 LAB — POC SARS CORONAVIRUS 2 AG -  ED: SARS Coronavirus 2 Ag: NEGATIVE

## 2019-12-04 LAB — URINALYSIS, ROUTINE W REFLEX MICROSCOPIC
Bilirubin Urine: NEGATIVE
Glucose, UA: NEGATIVE mg/dL
Hgb urine dipstick: NEGATIVE
Ketones, ur: NEGATIVE mg/dL
Leukocytes,Ua: NEGATIVE
Nitrite: NEGATIVE
Protein, ur: NEGATIVE mg/dL
Specific Gravity, Urine: 1.002 — ABNORMAL LOW (ref 1.005–1.030)
pH: 6 (ref 5.0–8.0)

## 2019-12-04 LAB — FIBRINOGEN: Fibrinogen: 341 mg/dL (ref 210–475)

## 2019-12-04 LAB — APTT: aPTT: 30 seconds (ref 24–36)

## 2019-12-04 LAB — C-REACTIVE PROTEIN: CRP: 0.8 mg/dL (ref <1.0)

## 2019-12-04 LAB — D-DIMER, QUANTITATIVE: D-Dimer, Quant: 0.5 ug/mL-FEU (ref 0.00–0.50)

## 2019-12-04 LAB — PROTIME-INR
INR: 1.2 (ref 0.8–1.2)
Prothrombin Time: 15.4 seconds — ABNORMAL HIGH (ref 11.4–15.2)

## 2019-12-04 LAB — PROCALCITONIN: Procalcitonin: 0.65 ng/mL

## 2019-12-04 LAB — SARS CORONAVIRUS 2 (TAT 6-24 HRS): SARS Coronavirus 2: NEGATIVE

## 2019-12-04 LAB — LACTATE DEHYDROGENASE: LDH: 239 U/L — ABNORMAL HIGH (ref 98–192)

## 2019-12-04 LAB — TRIGLYCERIDES: Triglycerides: 52 mg/dL (ref <150)

## 2019-12-04 MED ORDER — FAMOTIDINE IN NACL 20-0.9 MG/50ML-% IV SOLN
20.0000 mg | INTRAVENOUS | Status: DC
  Administered 2019-12-04: 09:00:00 20 mg via INTRAVENOUS
  Filled 2019-12-04 (×2): qty 50

## 2019-12-04 MED ORDER — DIPHENHYDRAMINE HCL 50 MG/ML IJ SOLN
25.0000 mg | Freq: Four times a day (QID) | INTRAMUSCULAR | Status: DC | PRN
  Administered 2019-12-04: 09:00:00 25 mg via INTRAVENOUS
  Filled 2019-12-04: qty 1

## 2019-12-04 MED ORDER — POTASSIUM CHLORIDE 10 MEQ/100ML IV SOLN
10.0000 meq | Freq: Once | INTRAVENOUS | Status: AC
  Administered 2019-12-04: 11:00:00 10 meq via INTRAVENOUS
  Filled 2019-12-04: qty 100

## 2019-12-04 MED ORDER — ONDANSETRON HCL 4 MG/2ML IJ SOLN: 4.0000 mg | Freq: Four times a day (QID) | INTRAMUSCULAR | Status: DC | PRN

## 2019-12-04 MED ORDER — MAGNESIUM SULFATE IN D5W 1-5 GM/100ML-% IV SOLN
1.0000 g | Freq: Once | INTRAVENOUS | Status: AC
  Administered 2019-12-04: 09:00:00 1 g via INTRAVENOUS
  Filled 2019-12-04: qty 100

## 2019-12-04 MED ORDER — DEXAMETHASONE SODIUM PHOSPHATE 10 MG/ML IJ SOLN
6.0000 mg | Freq: Once | INTRAMUSCULAR | Status: AC
  Administered 2019-12-04: 07:00:00 6 mg
  Filled 2019-12-04: qty 1

## 2019-12-04 MED ORDER — VANCOMYCIN HCL IN DEXTROSE 1-5 GM/200ML-% IV SOLN: 1000.0000 mg | Freq: Once | INTRAVENOUS | Status: DC

## 2019-12-04 MED ORDER — SODIUM CHLORIDE 0.9 % IV SOLN: 2.0000 g | Freq: Once | INTRAVENOUS | Status: DC

## 2019-12-04 MED ORDER — ONDANSETRON HCL 4 MG PO TABS: 4.0000 mg | ORAL_TABLET | Freq: Four times a day (QID) | ORAL | Status: DC | PRN

## 2019-12-04 MED ORDER — ACETAMINOPHEN 650 MG RE SUPP: 650.0000 mg | Freq: Four times a day (QID) | RECTAL | Status: DC | PRN

## 2019-12-04 MED ORDER — ACETAMINOPHEN 325 MG PO TABS: 650.0000 mg | ORAL_TABLET | Freq: Four times a day (QID) | ORAL | Status: DC | PRN

## 2019-12-04 MED ORDER — HYDROCODONE-ACETAMINOPHEN 5-325 MG PO TABS
1.0000 | ORAL_TABLET | Freq: Four times a day (QID) | ORAL | Status: DC | PRN
  Administered 2019-12-04: 07:00:00 1 via ORAL
  Filled 2019-12-04: qty 1

## 2019-12-04 MED ORDER — LACTATED RINGERS IV BOLUS (SEPSIS)
1000.0000 mL | Freq: Once | INTRAVENOUS | Status: AC
  Administered 2019-12-04: 09:00:00 1000 mL via INTRAVENOUS

## 2019-12-04 MED ORDER — SODIUM CHLORIDE 0.9% FLUSH
3.0000 mL | Freq: Once | INTRAVENOUS | Status: AC
  Administered 2019-12-04: 06:00:00 3 mL via INTRAVENOUS

## 2019-12-04 MED ORDER — ACETAMINOPHEN 325 MG PO TABS
650.0000 mg | ORAL_TABLET | Freq: Once | ORAL | Status: AC | PRN
  Administered 2019-12-04: 05:00:00 650 mg via ORAL
  Filled 2019-12-04: qty 2

## 2019-12-04 MED ORDER — IBUPROFEN 800 MG PO TABS
800.0000 mg | ORAL_TABLET | Freq: Once | ORAL | Status: AC
  Administered 2019-12-04: 06:00:00 800 mg via ORAL
  Filled 2019-12-04: qty 1

## 2019-12-04 MED ORDER — ENOXAPARIN SODIUM 40 MG/0.4ML ~~LOC~~ SOLN
40.0000 mg | SUBCUTANEOUS | Status: DC
  Filled 2019-12-04: qty 0.4

## 2019-12-04 MED ORDER — LACTATED RINGERS IV BOLUS (SEPSIS)
1000.0000 mL | Freq: Once | INTRAVENOUS | Status: AC
  Administered 2019-12-04: 07:00:00 1000 mL via INTRAVENOUS

## 2019-12-04 MED ORDER — METRONIDAZOLE IN NACL 5-0.79 MG/ML-% IV SOLN
500.0000 mg | Freq: Three times a day (TID) | INTRAVENOUS | Status: DC
  Administered 2019-12-04: 07:00:00 500 mg via INTRAVENOUS
  Filled 2019-12-04 (×3): qty 100

## 2019-12-04 MED ORDER — POTASSIUM CHLORIDE CRYS ER 20 MEQ PO TBCR
20.0000 meq | EXTENDED_RELEASE_TABLET | Freq: Once | ORAL | Status: AC
  Administered 2019-12-04: 07:00:00 20 meq via ORAL
  Filled 2019-12-04: qty 1

## 2019-12-04 MED ORDER — VANCOMYCIN HCL 1250 MG/250ML IV SOLN
1250.0000 mg | Freq: Once | INTRAVENOUS | Status: AC
  Administered 2019-12-04: 09:00:00 1250 mg via INTRAVENOUS
  Filled 2019-12-04: qty 250

## 2019-12-04 MED ORDER — VANCOMYCIN HCL 750 MG/150ML IV SOLN
750.0000 mg | Freq: Two times a day (BID) | INTRAVENOUS | Status: DC
  Filled 2019-12-04 (×4): qty 150

## 2019-12-04 MED ORDER — LACTATED RINGERS IV SOLN: INTRAVENOUS | Status: DC

## 2019-12-04 MED ORDER — NAPROXEN 250 MG PO TABS
500.0000 mg | ORAL_TABLET | ORAL | Status: DC
  Filled 2019-12-04: qty 2

## 2019-12-04 MED ORDER — SODIUM CHLORIDE 0.9 % IV SOLN
2.0000 g | Freq: Three times a day (TID) | INTRAVENOUS | Status: DC
  Administered 2019-12-04: 2 g via INTRAVENOUS
  Filled 2019-12-04 (×5): qty 2

## 2019-12-04 MED ORDER — GUAIFENESIN-DM 100-10 MG/5ML PO SYRP
5.0000 mL | ORAL_SOLUTION | ORAL | Status: DC | PRN
  Administered 2019-12-05: 5 mL via ORAL
  Filled 2019-12-04: qty 5

## 2019-12-04 MED ORDER — SENNOSIDES-DOCUSATE SODIUM 8.6-50 MG PO TABS: 1.0000 | ORAL_TABLET | Freq: Every evening | ORAL | Status: DC | PRN

## 2019-12-04 MED ORDER — SODIUM CHLORIDE 0.9% FLUSH: 3.0000 mL | Freq: Two times a day (BID) | INTRAVENOUS | Status: DC

## 2019-12-04 MED ORDER — ACETAMINOPHEN 325 MG PO TABS
325.0000 mg | ORAL_TABLET | Freq: Once | ORAL | Status: AC
  Administered 2019-12-04: 06:00:00 325 mg via ORAL
  Filled 2019-12-04: qty 1

## 2019-12-04 NOTE — H&P (Signed)
History and Physical    Susan Graves OVF:[id number] DOB: 12/14/1995 DOA: 12/04/2019  PCP: Patient, No Pcp Per   Patient coming from: Home   Chief Complaint: Fever, cough, sore throat, headache, vomiting  HPI: Susan Graves is a 24 y.o. female with medical history significant for ADHD, presenting to the emergency department for evaluation of fevers, cough, sore throat, and vomiting.  Patient reported having an associate that was recently infected with COVID-19, and the patient herself developed fevers and cough on 12/02/2019.  She has gone on to have worsening fevers, cough, sore throat, and has been vomiting without abdominal pain or diarrhea.  She denies any dysuria or flank pain.  She has had a frontal headache.  She denies neck stiffness.  Cough has been nonproductive, she has some chest discomfort that she attributes to her coughing.  ED Course: Upon arrival to the ED, patient is found to be febrile to 39.5 C, saturating low to mid 90s on room air, slightly tachypneic, and tachycardic in the 140s with blood pressure 95/58.  EKG features sinus tachycardia with rate 132 and there are no acute findings noted on chest x-ray.  Chemistry panel features a potassium of 3.1 and creatinine 1.03.  CBC notable for a normocytic anemia with hemoglobin 10.6, similar to prior.  Covid antigen test was negative.  Blood cultures were collected in the emergency department and the patient was treated with acetaminophen, Advil, Decadron, and remdesivir was ordered.  Hospitalists are asked to admit.  Review of Systems:  All other systems reviewed and apart from HPI, are negative.  Past Medical History:  Diagnosis Date  . ADHD     History reviewed. No pertinent surgical history.   reports that she has been smoking cigarettes. She has been smoking about 1.00 pack per day. She has never used smokeless tobacco. She reports that she does not drink alcohol or use drugs.  No Known Allergies  History reviewed. No  pertinent family history.   Prior to Admission medications   Medication Sig Start Date End Date Taking? Authorizing Provider  acetaminophen (TYLENOL) 650 MG CR tablet Take 650 mg by mouth every 8 (eight) hours as needed for pain or fever.    [provider]  acyclovir (ZOVIRAX) 400 MG tablet Take 1 tablet (400 mg total) by mouth 3 (three) times daily. Patient not taking: Reported on 05/26/2017 04/11/17   Trixie Dredge, PA-C  benzonatate (TESSALON) 100 MG capsule Take 1 capsule (100 mg total) by mouth every 8 (eight) hours. Patient not taking: Reported on 05/26/2017 04/07/17   Rolan Bucco, MD    Physical Exam: Vitals:   12/04/19 0431 12/04/19 0438 12/04/19 0630  BP: (!) 115/52  (!) 95/58  Pulse: (!) 144  (!) 130  Resp: 20  16  Temp: (!) 103.4 F (39.7 C) (!) 103.1 F (39.5 C)   TempSrc: Axillary Oral   SpO2: 94%  97%    Constitutional: Appears uncomfortable, no pallor or diaphoresis  Eyes: PERTLA, lids and conjunctivae normal ENMT: Mucous membranes are moist. Posterior pharynx clear of any exudate or lesions.   Neck: normal, supple, no masses, no thyromegaly Respiratory: clear to auscultation bilaterally, no wheezing, no crackles. No accessory muscle use.  Cardiovascular: Rate ~120 and regular. No extremity edema.  Abdomen: No distension, no tenderness, soft. Bowel sounds active.  Musculoskeletal: no clubbing / cyanosis. No joint deformity upper and lower extremities.  No meningismus.  Skin: no significant rashes, lesions, ulcers. Warm, dry, well-perfused. Neurologic: No facial asymmetry. Sensation  intact. Moving all extremities.  Psychiatric: Alert and oriented x 3. Pleasant and cooperative.    Labs and Imaging on Admission: I have personally reviewed following labs and imaging studies  CBC: Recent Labs  Lab 12/04/19 0440 12/04/19 0551  WBC 9.6 11.0*  NEUTROABS 8.1* 9.8*  HGB 10.6* 10.2*  HCT 33.7* 33.0*  MCV 80.0 80.5  PLT 267 161   Basic Metabolic  Panel: Recent Labs  Lab 12/04/19 0440  NA 134*  K 3.1*  CL 98  CO2 22  GLUCOSE 137*  BUN 7  CREATININE 1.03*  CALCIUM 8.6*   GFR: CrCl cannot be calculated (Unknown ideal weight.). Liver Function Tests: Recent Labs  Lab 12/04/19 0440  AST 30  ALT 17  ALKPHOS 57  BILITOT 0.4  PROT 7.1  ALBUMIN 3.8   No results for input(s): LIPASE, AMYLASE in the last 168 hours. No results for input(s): AMMONIA in the last 168 hours. Coagulation Profile: No results for input(s): INR, PROTIME in the last 168 hours. Cardiac Enzymes: No results for input(s): CKTOTAL, CKMB, CKMBINDEX, TROPONINI in the last 168 hours. BNP (last 3 results) No results for input(s): PROBNP in the last 8760 hours. HbA1C: No results for input(s): HGBA1C in the last 72 hours. CBG: No results for input(s): GLUCAP in the last 168 hours. Lipid Profile: No results for input(s): CHOL, HDL, LDLCALC, TRIG, CHOLHDL, LDLDIRECT in the last 72 hours. Thyroid Function Tests: No results for input(s): TSH, T4TOTAL, FREET4, T3FREE, THYROIDAB in the last 72 hours. Anemia Panel: No results for input(s): VITAMINB12, FOLATE, FERRITIN, TIBC, IRON, RETICCTPCT in the last 72 hours. Urine analysis:    Component Value Date/Time   COLORURINE AMBER (A) 03/31/2016 1345   APPEARANCEUR TURBID (A) 03/31/2016 1345   APPEARANCEUR Clear 08/23/2014 1452   LABSPEC 1.028 03/31/2016 1345   LABSPEC 1.011 08/23/2014 1452   PHURINE 5.0 03/31/2016 1345   GLUCOSEU NEGATIVE 03/31/2016 1345   GLUCOSEU Negative 08/23/2014 1452   HGBUR NEGATIVE 03/31/2016 1345   BILIRUBINUR NEGATIVE 03/31/2016 1345   BILIRUBINUR Negative 08/23/2014 1452   KETONESUR NEGATIVE 03/31/2016 1345   PROTEINUR 30 (A) 03/31/2016 1345   NITRITE NEGATIVE 03/31/2016 1345   LEUKOCYTESUR 3+ (A) 03/31/2016 1345   LEUKOCYTESUR Negative 08/23/2014 1452   Sepsis Labs: @LABRCNTIP (procalcitonin:4,lacticidven:4) )No results found for this or any previous visit (from the past 240  hour(s)).   Radiological Exams on Admission: DG Chest Portable 1 View  Result Date: 12/04/2019 CLINICAL DATA:  Cough EXAM: PORTABLE CHEST 1 VIEW COMPARISON:  04/07/2017 FINDINGS: Normal heart size and mediastinal contours. No acute infiltrate or edema. No effusion or pneumothorax. No acute osseous findings. Mild thoracic levocurvature. IMPRESSION: No active disease. Electronically Signed   By: Monte Fantasia M.D.   On: 12/04/2019 05:40    EKG: Independently reviewed. Sinus tachycardia (rate 132).   Assessment/Plan   1. Sepsis  - Presents with 2-3 days of fevers, headaches, fatigue, cough, sore throat, and vomiting  - She is found to have fever, marked tachycardia, SBP in 90's, mild renal insufficiency  - Blood cultures collected in ED and patient was treated with Decadron and remdesivir  - COVID-19 most likely despite negative Ag test, but given her severity illness and as the diagnosis is not yet confirmed, she will be fluid resuscitated and treated with empiric antibiotics  - Check COVID pcr, check UA, culture urine, check lactate and procalcitonin, treat with 30 cc/kg LR and empiric broad-spectrum antibiotics; if COVID pcr positive and procalcitonin low, antibiotics can be discontinued  2. Hypokalemia  - Serum potassium is 3.1 in ED, likely secondary to GI-losses  - Replace potassium, repeat chemistries tomorrow   4. Normocytic anemia  - Hgb is 10.6, similar to prior  - Patient denies seeing blood in her vomit and denies melena or hematochezia    DVT prophylaxis: Lovenox  Code Status: Full  Family Communication: Discussed with patient  Disposition Plan: will likely return home once acute respiratory illness is improved and stable  Consults called: None  Admission status: Inpatient     Briscoe Deutscher, MD [facility] Pager: See [email or web address]  If 7AM-7PM, please contact the daytime attending [email or web address]  12/04/2019, 6:40 AM

## 2019-12-04 NOTE — Progress Notes (Signed)
Arrived to patient's room and assessed for PIV placement with [place]. Patient has had multiple PIV's placed today. This patient refused PIV placement. This nurse notified patient's nurse and instructed to notify VAST is patient willing to have PIV placed. VU. Tomasita Morrow, RN
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
## 2019-12-04 NOTE — ED Notes (Signed)
Pt refusing to allow nursing staff to obtain temperature

## 2019-12-04 NOTE — ED Notes (Signed)
Xray came to get xray, reported pt was being uncooperative, please call when she can come back to get xray

## 2019-12-04 NOTE — ED Notes (Signed)
Ghimire MD notified of pt behaviors, pulling out lines, taking of medical equipment etc.

## 2019-12-04 NOTE — ED Notes (Signed)
Pts significant other stated he had covid and recently got out of quarantine.

## 2019-12-04 NOTE — ED Notes (Addendum)
Pt pulled out second IV, verbally aggressive with nursing staff, not open to communication with this nurse. Argumentative with nursing staff all morning, continues to staff split.

## 2019-12-04 NOTE — Progress Notes (Addendum)
Pharmacy Antibiotic Note  Susan Graves is a 24 y.o. female admitted on 12/04/2019 with sepsis.  Pharmacy has been consulted for vancomycin and cefepime dosing.  Plan: Vancomycin 1250mg  x1 then 750mg  IV Q12H. Goal AUC 400-550.  Expected AUC 460.  SCr used 1.03.  Cefepime 2g IV Q8H.  Temp (24hrs), Avg:103.3 F (39.6 C), Min:103.1 F (39.5 C), Max:103.4 F (39.7 C)  Recent Labs  Lab 12/04/19 0440 12/04/19 0551  WBC 9.6 11.0*  CREATININE 1.03*  --      No Known Allergies   Thank you for allowing pharmacy to be a part of this patient's care.  12/06/19, PharmD, BCPS  12/04/2019 6:52 AM

## 2019-12-04 NOTE — Progress Notes (Signed)
Admitted earlier this morning by my partner Dr. Antionette Char.    24 year old with history of ADHD-presenting with 1 day history of cough, fever, sore throat, headache, myalgias and vomiting-she apparently had a exposure to known Covid positive patient on 2/17.  Found to be febrile, and hypotensive on initial presentation-started on broad-spectrum antibiotics and IV fluid boluses.  Covid 19 antigen screen negative-but awaiting PCR results.  Subjective: Lying comfortably in bed-denies any chest pain or shortness of breath.  No longer coughing this morning.  Objective: Blood pressure (!) 84/53, pulse (!) 102, temperature (!) 103.1 F (39.5 C), temperature source Oral, resp. rate (!) 23, SpO2 97 %.  Lungs: Clear to auscultation  Abdomen: Soft nontender nondistended Skin: Developed hives earlier this morning  Assessment and plan: Sepsis with unknown foci of infection:still hypotensive but blood pressure improving with IV fluid boluses-lactate level mildly elevated.  Continue antimicrobial agents as outlined by Dr. Antionette Char.  Will await COVID-19 PCR results-if negative-we will send out a respiratory virus panel.  Hives: Itching-some macular/pruritic lesions in her bilateral arms and bilateral thighs.  Benadryl/Pepcid ordered-already received steroids earlier this morning.  Rest as outlined by Dr. Antionette Char   No charge note

## 2019-12-04 NOTE — ED Notes (Signed)
Date and time results received: 12/04/19  (use smartphrase ".now" to insert current time)  Test: Lactic Acid Critical Value: 2.5  Name of Provider Notified: Ghimire MD

## 2019-12-04 NOTE — Progress Notes (Signed)
2200 IV nurse came to start new PIV in which pt refused saying that her arms are swollen from being stuck so many times, NP Blount was called and notified and said to document pt refusal and pt  can talk to her physician in the morning.

## 2019-12-04 NOTE — ED Notes (Signed)
Pt spoke in mumbles and whines when responding to questions posed by this RN and Palumbo,MD.  Pt asked repeatedly to answer questions, pt's only response were tears and grumbles.

## 2019-12-04 NOTE — ED Provider Notes (Signed)
[facility] EM[facility]ENC[facility] DEPARTMENT Provider Note   CSN: [id number] Arrival date & time: 12/04/19  0417     History Chief Complaint  Patient presents with  . Cough    Susan Graves is a 24 y.o. female.  The history is provided by the patient. The history is limited by the condition of the patient.  Cough Cough characteristics:  Non-productive Severity:  Severe Onset quality:  Gradual Duration:  2 days Timing:  Intermittent Progression:  Worsening Chronicity:  New Context: upper respiratory infection   Context comment:  Exposure to a significant other covid Relieved by:  Nothing Worsened by:  Nothing Ineffective treatments:  None tried Associated symptoms: fever and rhinorrhea   Associated symptoms: no chest pain, no rash, no sore throat and no wheezing   Associated symptoms comment:  Loss of taste and smell, no diarrhea Fever:    Timing:  Constant   Temp source:  Axillary   Progression:  Unchanged Risk factors: no recent travel   Patient with significant other who just came off quarantine for covid and now patient has a fever, cough, and loss of taste and smell.  She has not taken any medication.  She is coughing to much to speak.       Past Medical History:  Diagnosis Date  . ADHD     There are no problems to display for this patient.   History reviewed. No pertinent surgical history.   OB History   No obstetric history on file.     History reviewed. No pertinent family history.  Social History   Tobacco Use  . Smoking status: Current Every Day Smoker    Packs/day: 1.00    Types: Cigarettes  . Smokeless tobacco: Never Used  Substance Use Topics  . Alcohol use: No  . Drug use: No    Home Medications Prior to Admission medications   Medication Sig Start Date End Date Taking? Authorizing Provider  acetaminophen (TYLENOL) 650 MG CR tablet Take 650 mg by mouth every 8 (eight) hours as needed for pain or fever.    [provider]  acyclovir (ZOVIRAX) 400 MG tablet Take 1 tablet (400 mg total) by mouth 3 (three) times daily. Patient not taking: Reported on 05/26/2017 04/11/17   Clayton Bibles, PA-C  benzonatate (TESSALON) 100 MG capsule Take 1 capsule (100 mg total) by mouth every 8 (eight) hours. Patient not taking: Reported on 05/26/2017 04/07/17   Malvin Johns, MD    Allergies    Patient has no known allergies.  Review of Systems   Review of Systems  Constitutional: Positive for fever.  HENT: Positive for rhinorrhea. Negative for drooling, facial swelling and sore throat.   Eyes: Negative for visual disturbance.  Respiratory: Positive for cough. Negative for wheezing.   Cardiovascular: Negative for chest pain, palpitations and leg swelling.  Gastrointestinal: Negative for diarrhea.  Genitourinary: Negative for difficulty urinating.  Skin: Negative for rash.  Neurological: Negative for speech difficulty.  Psychiatric/Behavioral: Negative for agitation.    Physical Exam Updated Vital Signs BP (!) 95/58   Pulse (!) 130   Temp (!) 103.1 F (39.5 C) (Oral)   Resp 16   SpO2 97%   Physical Exam Vitals and nursing note reviewed.  Constitutional:      Appearance: Normal appearance. She is not diaphoretic.  HENT:     Head: Normocephalic and atraumatic.     Nose: Rhinorrhea present.  Eyes:     Conjunctiva/sclera: Conjunctivae normal.  Pupils: Pupils are equal, round, and reactive to light.     Comments: Intact phonation  Cardiovascular:     Rate and Rhythm: Regular rhythm. Tachycardia present.     Pulses: Normal pulses.     Heart sounds: Normal heart sounds.  Pulmonary:     Effort: Pulmonary effort is normal. Tachypnea present.     Breath sounds: Normal breath sounds.  Abdominal:     General: Abdomen is flat. Bowel sounds are normal.     Tenderness: There is no abdominal tenderness. There is no guarding.  Musculoskeletal:        General: No tenderness. Normal range of motion.      Cervical back: Normal range of motion and neck supple.     Left lower leg: No edema.  Lymphadenopathy:     Cervical: No cervical adenopathy.  Skin:    General: Skin is warm and dry.     Capillary Refill: Capillary refill takes less than 2 seconds.  Neurological:     General: No focal deficit present.     Mental Status: She is alert.  Psychiatric:        Thought Content: Thought content normal.     ED Results / Procedures / Treatments   Labs (all labs ordered are listed, but only abnormal results are displayed) Results for orders placed or performed during the hospital encounter of 12/04/19  Comprehensive metabolic panel  Result Value Ref Range   Sodium 134 (L) 135 - 145 mmol/L   Potassium 3.1 (L) 3.5 - 5.1 mmol/L   Chloride 98 98 - 111 mmol/L   CO2 22 22 - 32 mmol/L   Glucose, Bld 137 (H) 70 - 99 mg/dL   BUN 7 6 - 20 mg/dL   Creatinine, Ser 1.03 (H) 0.44 - 1.00 mg/dL   Calcium 8.6 (L) 8.9 - 10.3 mg/dL   Total Protein 7.1 6.5 - 8.1 g/dL   Albumin 3.8 3.5 - 5.0 g/dL   AST 30 15 - 41 U/L   ALT 17 0 - 44 U/L   Alkaline Phosphatase 57 38 - 126 U/L   Total Bilirubin 0.4 0.3 - 1.2 mg/dL   GFR calc non Af Amer >60 >60 mL/min   GFR calc Af Amer >60 >60 mL/min   Anion gap 14 5 - 15  CBC with Differential  Result Value Ref Range   WBC 9.6 4.0 - 10.5 K/uL   RBC 4.21 3.87 - 5.11 MIL/uL   Hemoglobin 10.6 (L) 12.0 - 15.0 g/dL   HCT 33.7 (L) 36.0 - 46.0 %   MCV 80.0 80.0 - 100.0 fL   MCH 25.2 (L) 26.0 - 34.0 pg   MCHC 31.5 30.0 - 36.0 g/dL   RDW 14.4 11.5 - 15.5 %   Platelets 267 150 - 400 K/uL   nRBC 0.0 0.0 - 0.2 %   Neutrophils Relative % 85 %   Neutro Abs 8.1 (H) 1.7 - 7.7 K/uL   Lymphocytes Relative 10 %   Lymphs Abs 1.0 0.7 - 4.0 K/uL   Monocytes Relative 5 %   Monocytes Absolute 0.5 0.1 - 1.0 K/uL   Eosinophils Relative 0 %   Eosinophils Absolute 0.0 0.0 - 0.5 K/uL   Basophils Relative 0 %   Basophils Absolute 0.0 0.0 - 0.1 K/uL   Immature Granulocytes 0 %   Abs  Immature Granulocytes 0.02 0.00 - 0.07 K/uL  I-Stat beta hCG blood, ED  Result Value Ref Range   I-stat hCG, quantitative <5.0 <5  mIU/mL   Comment 3          POC SARS Coronavirus 2 Ag-ED - Nasal Swab (BD Veritor Kit)  Result Value Ref Range   SARS Coronavirus 2 Ag NEGATIVE NEGATIVE  I-Stat beta hCG blood, ED  Result Value Ref Range   I-stat hCG, quantitative <5.0 <5 mIU/mL   Comment 3           DG Chest Portable 1 View  Result Date: 12/04/2019 CLINICAL DATA:  Cough EXAM: PORTABLE CHEST 1 VIEW COMPARISON:  04/07/2017 FINDINGS: Normal heart size and mediastinal contours. No acute infiltrate or edema. No effusion or pneumothorax. No acute osseous findings. Mild thoracic levocurvature. IMPRESSION: No active disease. Electronically Signed   By: Monte Fantasia M.D.   On: 12/04/2019 05:40    EKG EKG Interpretation  Date/Time:  Friday December 04 2019 04:58:39 EST Ventricular Rate:  132 PR Interval:    QRS Duration: 70 QT Interval:  266 QTC Calculation: 395 R Axis:   72 Text Interpretation: Sinus tachycardia Confirmed by Dory Horn) on 12/04/2019 5:20:32 AM   Radiology DG Chest Portable 1 View  Result Date: 12/04/2019 CLINICAL DATA:  Cough EXAM: PORTABLE CHEST 1 VIEW COMPARISON:  04/07/2017 FINDINGS: Normal heart size and mediastinal contours. No acute infiltrate or edema. No effusion or pneumothorax. No acute osseous findings. Mild thoracic levocurvature. IMPRESSION: No active disease. Electronically Signed   By: Monte Fantasia M.D.   On: 12/04/2019 05:40    Procedures Procedures (including critical care time)  Medications Ordered in ED Medications  acetaminophen (TYLENOL) tablet 650 mg (650 mg Oral Given 12/04/19 0439)  sodium chloride flush (NS) 0.9 % injection 3 mL (3 mLs Intravenous Given 12/04/19 0625)  acetaminophen (TYLENOL) tablet 325 mg (325 mg Oral Given 12/04/19 0607)  ibuprofen (ADVIL) tablet 800 mg (800 mg Oral Given 12/04/19 0607)  dexamethasone (DECADRON)  injection 6 mg (6 mg Other Given 12/04/19 0630)    ED Course  I have reviewed the triage vital signs and the nursing notes.  Pertinent labs & imaging results that were available during my care of the patient were reviewed by me and considered in my medical decision making (see chart for details).  Patient desaturated to 89% with ambulation in the room.   Final Clinical Impression(s) / ED Diagnoses Final diagnoses:  COVID-19    Symptoms and history are consistent with covid 19.  Will admit medicine    Aleayah Chico, MD 12/04/19 [phone]

## 2019-12-04 NOTE — ED Notes (Signed)
Pt refused EKG, unable to get oral temp at this moment due to coughing. RN, Tresa Endo noted.

## 2019-12-04 NOTE — ED Notes (Signed)
This RN took tape off of pt's arm in an attempt to start IV. Pt ripped arm away and shrieked "NO ma'am." this RN asked what was wrong pt would not respond, only crying and attempting to call mother. Later pt stated regarding this RN "She is fucking rude and she is not doing anything to me."   Task handed off to [place], [place]

## 2019-12-04 NOTE — Progress Notes (Signed)
Patient c/o bleeding when having a BM. Pt states she does have hemorrhoids.

## 2019-12-04 NOTE — ED Triage Notes (Addendum)
Pt c/o cough in triage, febrile. Denies ibuprofen or tylenol PTA.   Not willing to answer further questions in triage.

## 2019-12-04 NOTE — ED Notes (Signed)
REPORT TO DR.PALUMBO POC COVID IS NEGITIVE

## 2019-12-04 NOTE — ED Notes (Signed)
Hospitalist at bedside 

## 2019-12-05 LAB — COMPREHENSIVE METABOLIC PANEL
ALT: 17 U/L (ref 0–44)
AST: 32 U/L (ref 15–41)
Albumin: 3 g/dL — ABNORMAL LOW (ref 3.5–5.0)
Alkaline Phosphatase: 43 U/L (ref 38–126)
Anion gap: 9 (ref 5–15)
BUN: 6 mg/dL (ref 6–20)
CO2: 26 mmol/L (ref 22–32)
Calcium: 8.5 mg/dL — ABNORMAL LOW (ref 8.9–10.3)
Chloride: 107 mmol/L (ref 98–111)
Creatinine, Ser: 0.76 mg/dL (ref 0.44–1.00)
GFR calc Af Amer: >60 mL/min (ref >60)
GFR calc non Af Amer: >60 mL/min (ref >60)
Glucose, Bld: 99 mg/dL (ref 70–99)
Potassium: 4 mmol/L (ref 3.5–5.1)
Sodium: 142 mmol/L (ref 135–145)
Total Bilirubin: 0.4 mg/dL (ref 0.3–1.2)
Total Protein: 6.1 g/dL — ABNORMAL LOW (ref 6.5–8.1)

## 2019-12-05 LAB — CBC WITH DIFFERENTIAL/PLATELET
Abs Immature Granulocytes: 0.04 10*3/uL (ref 0.00–0.07)
Basophils Absolute: 0 10*3/uL (ref 0.0–0.1)
Basophils Relative: 0 %
Eosinophils Absolute: 0 10*3/uL (ref 0.0–0.5)
Eosinophils Relative: 0 %
HCT: 33.1 % — ABNORMAL LOW (ref 36.0–46.0)
Hemoglobin: 10.2 g/dL — ABNORMAL LOW (ref 12.0–15.0)
Immature Granulocytes: 0 %
Lymphocytes Relative: 20 %
Lymphs Abs: 2 10*3/uL (ref 0.7–4.0)
MCH: 24.7 pg — ABNORMAL LOW (ref 26.0–34.0)
MCHC: 30.8 g/dL (ref 30.0–36.0)
MCV: 80.1 fL (ref 80.0–100.0)
Monocytes Absolute: 0.7 10*3/uL (ref 0.1–1.0)
Monocytes Relative: 7 %
Neutro Abs: 6.9 10*3/uL (ref 1.7–7.7)
Neutrophils Relative %: 73 %
Platelets: 234 10*3/uL (ref 150–400)
RBC: 4.13 MIL/uL (ref 3.87–5.11)
RDW: 14.6 % (ref 11.5–15.5)
WBC: 9.6 10*3/uL (ref 4.0–10.5)
nRBC: 0 % (ref 0.0–0.2)

## 2019-12-05 LAB — RAPID URINE DRUG SCREEN, HOSP PERFORMED
Amphetamines: NOT DETECTED
Barbiturates: NOT DETECTED
Benzodiazepines: NOT DETECTED
Cocaine: POSITIVE — AB
Opiates: NOT DETECTED
Tetrahydrocannabinol: POSITIVE — AB

## 2019-12-05 LAB — PREGNANCY, URINE: Preg Test, Ur: NEGATIVE

## 2019-12-05 LAB — URINE CULTURE

## 2019-12-05 LAB — MAGNESIUM: Magnesium: 2 mg/dL (ref 1.7–2.4)

## 2019-12-05 MED ORDER — SENNOSIDES-DOCUSATE SODIUM 8.6-50 MG PO TABS: 2.0000 | ORAL_TABLET | Freq: Two times a day (BID) | ORAL | Status: DC

## 2019-12-05 MED ORDER — DULCOLAX 5 MG PO TBEC: 10.0000 mg | DELAYED_RELEASE_TABLET | Freq: Two times a day (BID) | ORAL | 1 refills | Status: DC

## 2019-12-05 MED ORDER — GUAIFENESIN-DM 100-10 MG/5ML PO SYRP: 5.0000 mL | ORAL_SOLUTION | ORAL | 0 refills | Status: DC | PRN

## 2019-12-05 MED ORDER — AZITHROMYCIN 500 MG PO TABS: 500.0000 mg | ORAL_TABLET | Freq: Every day | ORAL | 0 refills | Status: AC

## 2019-12-05 MED ORDER — ONDANSETRON HCL 4 MG PO TABS: 4.0000 mg | ORAL_TABLET | Freq: Four times a day (QID) | ORAL | 0 refills | Status: DC | PRN

## 2019-12-05 MED ORDER — ACETAMINOPHEN 325 MG PO TABS: 650.0000 mg | ORAL_TABLET | Freq: Four times a day (QID) | ORAL | Status: DC | PRN

## 2019-12-05 NOTE — Discharge Summary (Addendum)
Physician Discharge Summary  Susan Graves BDZ:[id number] DOB: [birth year] DOA: 12/04/2019  PCP: Patient, No Pcp Per  Admit date: 12/04/2019 Discharge date: 12/05/2019  Admitted From: Home Disposition: Home Recommendations for Outpatient Follow-up:  1. Follow up with community health and wellness in 1 to 2 weeks 2. Please obtain BMP/CBC in one week  Home Health: None Equipment/Devices: None  Discharge Condition stable and improved CODE STATUS: Full code Diet recommendation: Cardiac Brief/Interim Summary:24 y.o. female with medical history significant for ADHD, presenting to the emergency department for evaluation of fevers, cough, sore throat, and vomiting.  Patient reported having an associate that was recently infected with COVID-19, and the patient herself developed fevers and cough on 12/02/2019.  She has gone on to have worsening fevers, cough, sore throat, and has been vomiting without abdominal pain or diarrhea.  She denies any dysuria or flank pain.  She has had a frontal headache.  She denies neck stiffness.  Cough has been nonproductive, she has some chest discomfort that she attributes to her coughing.  ED Course: Upon arrival to the ED, patient is found to be febrile to 39.5 C, saturating low to mid 90s on room air, slightly tachypneic, and tachycardic in the 140s with blood pressure 95/58.  EKG features sinus tachycardia with rate 132 and there are no acute findings noted on chest x-ray.  Chemistry panel features a potassium of 3.1 and creatinine 1.03.  CBC notable for a normocytic anemia with hemoglobin 10.6, similar to prior.  Covid antigen test was negative.  Blood cultures were collected in the emergency department and the patient was treated with acetaminophen, Advil, Decadron, and remdesivir was ordered.  Hospitalists are asked to admit.   Discharge Diagnoses:  Active Problems:   Sepsis (HCC)  #1 sepsis ruled out-patient was initially thought to be septic due to fever, and  hypotension and tachycardia.  She got 1 dose of Vanco and cefepime.  Since then patient has pulled her IV out and has been refusing further IV treatments or IV fluids.  COVID-19 POC was negative PCR was negative respiratory virus panel is also negative with a negative chest x-ray. Urine culture shows no growth Normal procalcitonin normal CRP Mildly elevated lactate which was treated with IV fluids  she has been afebrile over 24 hours. She will be sent out on Z-Pak for 5 days. Her mother reported that she smokes cigarettes, she vapes.  Encourage patient to quit vaping and smoking cigarettes. I have ordered a urine drug screen and an HIV test prior to discharge. Addendum her urine drug screen came back positive for cocaine and THC.  Urine pregnancy test was negative.  #2 hemorrhoids patient advised to take hemorrhoid cream and avoid constipation stool softeners prescribed.  She needs to follow-up with her PCP and may be get a GI eval as an outpatient.  Her hemoglobin has remained stable at 10.2 in spite of IV fluids.  #3 hypokalemia replaced in the ER normal on discharge.  #4 chronic normocytic anemia needs follow-up as an outpatient.   Estimated body mass index is 22.27 kg/m as calculated from the following:   Height as of 09/15/18: 5\' 6"  (1.676 m).   Weight as of 09/15/18: 62.6 kg.  Discharge Instructions  Discharge Instructions    Call MD for:  difficulty breathing, headache or visual disturbances   Complete by: As directed    Call MD for:  persistant dizziness or light-headedness   Complete by: As directed    Call MD for:  persistant  nausea and vomiting   Complete by: As directed    Diet - low sodium heart healthy   Complete by: As directed    Increase activity slowly   Complete by: As directed      Allergies as of 12/05/2019   No Known Allergies     Medication List    STOP taking these medications   acyclovir 400 MG tablet Commonly known as: ZOVIRAX   benzonatate 100 MG  capsule Commonly known as: TESSALON     TAKE these medications   acetaminophen 325 MG tablet Commonly known as: TYLENOL Take 2 tablets (650 mg total) by mouth every 6 (six) hours as needed for mild pain (or Fever >/= 101).   azithromycin 500 MG tablet Commonly known as: Zithromax Take 1 tablet (500 mg total) by mouth daily for 3 days. Take 1 tablet daily for 3 days.   Dulcolax 5 MG EC tablet Generic drug: bisacodyl Take 2 tablets (10 mg total) by mouth in the morning and at bedtime.   guaiFENesin-dextromethorphan 100-10 MG/5ML syrup Commonly known as: ROBITUSSIN DM Take 5 mLs by mouth every 4 (four) hours as needed for cough.   ondansetron 4 MG tablet Commonly known as: ZOFRAN Take 1 tablet (4 mg total) by mouth every 6 (six) hours as needed for nausea.   senna-docusate 8.6-50 MG tablet Commonly known as: Senokot-S Take 2 tablets by mouth 2 (two) times daily.      Follow-up Information    [facility] Follow up.   Contact information: [address] [place] [address] [phone]         No Known Allergies  Consultations: None  Procedures/Studies: DG Chest Portable 1 View  Result Date: 12/04/2019 CLINICAL DATA:  Cough EXAM: PORTABLE CHEST 1 VIEW COMPARISON:  04/07/2017 FINDINGS: Normal heart size and mediastinal contours. No acute infiltrate or edema. No effusion or pneumothorax. No acute osseous findings. Mild thoracic levocurvature. IMPRESSION: No active disease. Electronically Signed   By: Marnee Spring M.D.   On: 12/04/2019 05:40    (Echo, Carotid, EGD, Colonoscopy, ERCP)    Subjective: Sitting up asking for coffee talking in full sentences vital signs normal denies shortness of breath occasional cough  Discharge Exam: Vitals:   12/04/19 2057 12/05/19 0807  BP: 107/71 101/70  Pulse: 88 86  Resp: 16   Temp: 97.6 F (36.4 C) 98 F (36.7 C)  SpO2: 100% 98%   Vitals:   12/04/19 1322 12/04/19  1555 12/04/19 2057 12/05/19 0807  BP: 101/69 108/79 107/71 101/70  Pulse: 94 100 88 86  Resp: 18 20 16    Temp:  97.7 F (36.5 C) 97.6 F (36.4 C) 98 F (36.7 C)  TempSrc:   Oral Oral  SpO2: 98% 100% 100% 98%    General: Pt is alert, awake, not in acute distress Cardiovascular: RRR, S1/S2 +, no rubs, no gallops Respiratory: CTA bilaterally, no wheezing, no rhonchi Abdominal: Soft, NT, ND, bowel sounds + Extremities: no edema, no cyanosis    The results of significant diagnostics from this hospitalization (including imaging, microbiology, ancillary and laboratory) are listed below for reference.     Microbiology: Recent Results (from the past 240 hour(s))  Blood Culture (routine x 2)     Status: None (Preliminary result)   Collection Time: 12/04/19  5:51 AM   Specimen: BLOOD LEFT HAND  Result Value Ref Range Status   Specimen Description BLOOD LEFT HAND  Final   Special Requests  Final    BOTTLES DRAWN AEROBIC AND ANAEROBIC Blood Culture results may not be optimal due to an inadequate volume of blood received in culture bottles   Culture NO GROWTH 1 DAY  Final   Report Status PENDING  Incomplete  Blood Culture (routine x 2)     Status: None (Preliminary result)   Collection Time: 12/04/19  5:51 AM   Specimen: BLOOD  Result Value Ref Range Status   Specimen Description BLOOD LEFT ARM  Final   Special Requests   Final    BOTTLES DRAWN AEROBIC AND ANAEROBIC Blood Culture results may not be optimal due to an inadequate volume of blood received in culture bottles   Culture NO GROWTH 1 DAY  Final   Report Status PENDING  Incomplete  SARS CORONAVIRUS 2 (TAT 6-24 HRS) Nasopharyngeal Nasopharyngeal Swab     Status: None   Collection Time: 12/04/19  6:25 AM   Specimen: Nasopharyngeal Swab  Result Value Ref Range Status   SARS Coronavirus 2 NEGATIVE NEGATIVE Final    Comment: (NOTE) SARS-CoV-2 target nucleic acids are NOT DETECTED. The SARS-CoV-2 RNA is generally detectable in  upper and lower respiratory specimens during the acute phase of infection. Negative results do not preclude SARS-CoV-2 infection, do not rule out co-infections with other pathogens, and should not be used as the sole basis for treatment or other patient management decisions. Negative results must be combined with clinical observations, patient history, and epidemiological information. The expected result is Negative. Fact Sheet for Patients: HairSlick.nohttps://www.fda.gov/media/138098/download Fact Sheet for Healthcare Providers: quierodirigir.comhttps://www.fda.gov/media/138095/download This test is not yet approved or cleared by the [place] FDA and  has been authorized for detection and/or diagnosis of SARS-CoV-2 by FDA under an Emergency Use Authorization (EUA). This EUA will remain  in effect (meaning this test can be used) for the duration of the COVID-19 declaration under Section 56 4(b)(1) of the Act, 21 U.S.C. section 360bbb-3(b)(1), unless the authorization is terminated or revoked sooner. Performed at [facility], [address] [place], [place] [address]   Culture, Urine     Status: Abnormal   Collection Time: 12/04/19  9:16 AM   Specimen: Urine, Random  Result Value Ref Range Status   Specimen Description URINE, RANDOM  Final   Special Requests   Final    NONE Performed at [facility], [address] [place], [place] [address]    Culture MULTIPLE SPECIES PRESENT, SUGGEST RECOLLECTION (A)  Final   Report Status 12/05/2019 FINAL  Final  Respiratory Panel by PCR     Status: None   Collection Time: 12/04/19 12:41 PM   Specimen: Nasopharyngeal Swab; Respiratory  Result Value Ref Range Status   Adenovirus NOT DETECTED NOT DETECTED Final   Coronavirus 229E NOT DETECTED NOT DETECTED Final    Comment: (NOTE) The Coronavirus on the Respiratory Panel, DOES NOT test for the novel  Coronavirus (2019 nCoV)    Coronavirus HKU1 NOT DETECTED NOT DETECTED Final   Coronavirus NL63 NOT  DETECTED NOT DETECTED Final   Coronavirus OC43 NOT DETECTED NOT DETECTED Final   Metapneumovirus NOT DETECTED NOT DETECTED Final   Rhinovirus / Enterovirus NOT DETECTED NOT DETECTED Final   Influenza A NOT DETECTED NOT DETECTED Final   Influenza B NOT DETECTED NOT DETECTED Final   Parainfluenza Virus 1 NOT DETECTED NOT DETECTED Final   Parainfluenza Virus 2 NOT DETECTED NOT DETECTED Final   Parainfluenza Virus 3 NOT DETECTED NOT DETECTED Final   Parainfluenza Virus 4  NOT DETECTED NOT DETECTED Final   Respiratory Syncytial Virus NOT DETECTED NOT DETECTED Final   Bordetella pertussis NOT DETECTED NOT DETECTED Final   Chlamydophila pneumoniae NOT DETECTED NOT DETECTED Final   Mycoplasma pneumoniae NOT DETECTED NOT DETECTED Final    Comment: Performed at [facility], [place] [address] [place], [place] [address]     Labs: BNP (last 3 results) No results for input(s): BNP in the last 8760 hours. Basic Metabolic Panel: Recent Labs  Lab 12/04/19 0440 12/04/19 0551 12/05/19 0602  NA 134* 133* 142  K 3.1* 3.2* 4.0  CL 98 100 107  CO2 22 22 26   GLUCOSE 137* 130* 99  BUN 7 6 6   CREATININE 1.03* 0.90 0.76  CALCIUM 8.6* 8.3* 8.5*  MG  --   --  2.0   Liver Function Tests: Recent Labs  Lab 12/04/19 0440 12/04/19 0551 12/05/19 0602  AST 30 28 32  ALT 17 16 17   ALKPHOS 57 54 43  BILITOT 0.4 0.4 0.4  PROT 7.1 6.8 6.1*  ALBUMIN 3.8 3.7 3.0*   No results for input(s): LIPASE, AMYLASE in the last 168 hours. No results for input(s): AMMONIA in the last 168 hours. CBC: Recent Labs  Lab 12/04/19 0440 12/04/19 0551 12/05/19 0602  WBC 9.6 11.0* 9.6  NEUTROABS 8.1* 9.8* 6.9  HGB 10.6* 10.2* 10.2*  HCT 33.7* 33.0* 33.1*  MCV 80.0 80.5 80.1  PLT 267 253 234   Cardiac Enzymes: No results for input(s): CKTOTAL, CKMB, CKMBINDEX, TROPONINI in the last 168 hours. BNP: Invalid input(s): POCBNP CBG: No results for input(s): GLUCAP in the last 168 hours. D-Dimer Recent Labs     12/04/19 0551  DDIMER 0.50   Hgb A1c No results for input(s): HGBA1C in the last 72 hours. Lipid Profile Recent Labs    12/04/19 0551  TRIG 52   Thyroid function studies No results for input(s): TSH, T4TOTAL, T3FREE, THYROIDAB in the last 72 hours.  Invalid input(s): FREET3 Anemia work up Recent Labs    12/04/19 0551  FERRITIN 17   Urinalysis    Component Value Date/Time   COLORURINE STRAW (A) 12/04/2019 0916   APPEARANCEUR CLEAR 12/04/2019 0916   APPEARANCEUR Clear 08/23/2014 1452   LABSPEC 1.002 (L) 12/04/2019 0916   LABSPEC 1.011 08/23/2014 1452   PHURINE 6.0 12/04/2019 0916   GLUCOSEU NEGATIVE 12/04/2019 0916   GLUCOSEU Negative 08/23/2014 1452   HGBUR NEGATIVE 12/04/2019 0916   BILIRUBINUR NEGATIVE 12/04/2019 0916   BILIRUBINUR Negative 08/23/2014 1452   KETONESUR NEGATIVE 12/04/2019 0916   PROTEINUR NEGATIVE 12/04/2019 0916   NITRITE NEGATIVE 12/04/2019 0916   LEUKOCYTESUR NEGATIVE 12/04/2019 0916   LEUKOCYTESUR Negative 08/23/2014 1452   Sepsis Labs Invalid input(s): PROCALCITONIN,  WBC,  LACTICIDVEN Microbiology Recent Results (from the past 240 hour(s))  Blood Culture (routine x 2)     Status: None (Preliminary result)   Collection Time: 12/04/19  5:51 AM   Specimen: BLOOD LEFT HAND  Result Value Ref Range Status   Specimen Description BLOOD LEFT HAND  Final   Special Requests   Final    BOTTLES DRAWN AEROBIC AND ANAEROBIC Blood Culture results may not be optimal due to an inadequate volume of blood received in culture bottles   Culture NO GROWTH 1 DAY  Final   Report Status PENDING  Incomplete  Blood Culture (routine x 2)     Status: None (Preliminary result)   Collection Time: 12/04/19  5:51 AM   Specimen: BLOOD  Result  Value Ref Range Status   Specimen Description BLOOD LEFT ARM  Final   Special Requests   Final    BOTTLES DRAWN AEROBIC AND ANAEROBIC Blood Culture results may not be optimal due to an inadequate volume of blood received in  culture bottles   Culture NO GROWTH 1 DAY  Final   Report Status PENDING  Incomplete  SARS CORONAVIRUS 2 (TAT 6-24 HRS) Nasopharyngeal Nasopharyngeal Swab     Status: None   Collection Time: 12/04/19  6:25 AM   Specimen: Nasopharyngeal Swab  Result Value Ref Range Status   SARS Coronavirus 2 NEGATIVE NEGATIVE Final    Comment: (NOTE) SARS-CoV-2 target nucleic acids are NOT DETECTED. The SARS-CoV-2 RNA is generally detectable in upper and lower respiratory specimens during the acute phase of infection. Negative results do not preclude SARS-CoV-2 infection, do not rule out co-infections with other pathogens, and should not be used as the sole basis for treatment or other patient management decisions. Negative results must be combined with clinical observations, patient history, and epidemiological information. The expected result is Negative. Fact Sheet for Patients: HairSlick.no Fact Sheet for Healthcare Providers: quierodirigir.com This test is not yet approved or cleared by the [place] FDA and  has been authorized for detection and/or diagnosis of SARS-CoV-2 by FDA under an Emergency Use Authorization (EUA). This EUA will remain  in effect (meaning this test can be used) for the duration of the COVID-19 declaration under Section 56 4(b)(1) of the Act, 21 U.S.C. section 360bbb-3(b)(1), unless the authorization is terminated or revoked sooner. Performed at [facility], [address] [place], [place] [address]   Culture, Urine     Status: Abnormal   Collection Time: 12/04/19  9:16 AM   Specimen: Urine, Random  Result Value Ref Range Status   Specimen Description URINE, RANDOM  Final   Special Requests   Final    NONE Performed at [facility], [address], [place], [place] [address]    Culture MULTIPLE SPECIES PRESENT, SUGGEST RECOLLECTION (A)  Final   Report Status 12/05/2019 FINAL  Final   Respiratory Panel by PCR     Status: None   Collection Time: 12/04/19 12:41 PM   Specimen: Nasopharyngeal Swab; Respiratory  Result Value Ref Range Status   Adenovirus NOT DETECTED NOT DETECTED Final   Coronavirus 229E NOT DETECTED NOT DETECTED Final    Comment: (NOTE) The Coronavirus on the Respiratory Panel, DOES NOT test for the novel  Coronavirus (2019 nCoV)    Coronavirus HKU1 NOT DETECTED NOT DETECTED Final   Coronavirus NL63 NOT DETECTED NOT DETECTED Final   Coronavirus OC43 NOT DETECTED NOT DETECTED Final   Metapneumovirus NOT DETECTED NOT DETECTED Final   Rhinovirus / Enterovirus NOT DETECTED NOT DETECTED Final   Influenza A NOT DETECTED NOT DETECTED Final   Influenza B NOT DETECTED NOT DETECTED Final   Parainfluenza Virus 1 NOT DETECTED NOT DETECTED Final   Parainfluenza Virus 2 NOT DETECTED NOT DETECTED Final   Parainfluenza Virus 3 NOT DETECTED NOT DETECTED Final   Parainfluenza Virus 4 NOT DETECTED NOT DETECTED Final   Respiratory Syncytial Virus NOT DETECTED NOT DETECTED Final   Bordetella pertussis NOT DETECTED NOT DETECTED Final   Chlamydophila pneumoniae NOT DETECTED NOT DETECTED Final   Mycoplasma pneumoniae NOT DETECTED NOT DETECTED Final    Comment: Performed at [facility], [address], [place], [place] [address]     Time coordinating discharge:  SIGNED:  Alwyn Ren, MD  [facility] 12/05/2019, 10:31 AM Pager   If 7PM-7AM, please contact night-coverage [email or web address] Password [email or web address]

## 2019-12-06 LAB — HIV ANTIBODY (ROUTINE TESTING W REFLEX): HIV Screen 4th Generation wRfx: NONREACTIVE — AB

## 2019-12-09 LAB — CULTURE, BLOOD (ROUTINE X 2)
Culture: NO GROWTH
Culture: NO GROWTH

## 2020-01-05 DIAGNOSIS — Z915 Personal history of self-harm: Secondary | ICD-10-CM

## 2020-01-05 DIAGNOSIS — IMO0002 Reserved for concepts with insufficient information to code with codable children: Secondary | ICD-10-CM

## 2020-01-06 ENCOUNTER — Ambulatory Visit: Payer: Self-pay

## 2020-01-20 ENCOUNTER — Encounter (HOSPITAL_COMMUNITY): Payer: Self-pay

## 2020-01-20 ENCOUNTER — Emergency Department (HOSPITAL_COMMUNITY)
Admission: EM | Admit: 2020-01-20 | Discharge: 2020-01-20 | Disposition: A | Discharge status: Home / Self Care | Payer: 59 | Attending: Emergency Medicine | Admitting: Emergency Medicine

## 2020-01-20 ENCOUNTER — Ambulatory Visit (HOSPITAL_COMMUNITY)
Admission: EM | Admit: 2020-01-20 | Discharge: 2020-01-20 | Disposition: A | Discharge status: Home / Self Care | Payer: 59 | Attending: Family Medicine | Admitting: Family Medicine

## 2020-01-20 ENCOUNTER — Other Ambulatory Visit: Payer: Self-pay

## 2020-01-20 ENCOUNTER — Encounter (HOSPITAL_COMMUNITY): Payer: Self-pay | Admitting: Pediatrics

## 2020-01-20 DIAGNOSIS — K6289 Other specified diseases of anus and rectum: Secondary | ICD-10-CM

## 2020-01-20 DIAGNOSIS — Z5321 Procedure and treatment not carried out due to patient leaving prior to being seen by health care provider: Secondary | ICD-10-CM | POA: Insufficient documentation

## 2020-01-20 DIAGNOSIS — K648 Other hemorrhoids: Secondary | ICD-10-CM

## 2020-01-20 DIAGNOSIS — K649 Unspecified hemorrhoids: Secondary | ICD-10-CM | POA: Diagnosis not present

## 2020-01-20 MED ORDER — KETOROLAC TROMETHAMINE 30 MG/ML IJ SOLN
30.0000 mg | Freq: Once | INTRAMUSCULAR | Status: AC
  Administered 2020-01-20: 30 mg via INTRAMUSCULAR

## 2020-01-20 MED ORDER — KETOROLAC TROMETHAMINE 30 MG/ML IJ SOLN
INTRAMUSCULAR | Status: AC
  Filled 2020-01-20: qty 1

## 2020-01-20 MED ORDER — HYDROCODONE-ACETAMINOPHEN 5-325 MG PO TABS: 2.0000 | ORAL_TABLET | ORAL | 0 refills | Status: DC | PRN

## 2020-01-20 NOTE — ED Triage Notes (Signed)
Pt c/o hemorrhoids. Pt has 10/10 pain from hemorrhoid. Pt can't sit on buttocks and is crying in pain in exam room.

## 2020-01-20 NOTE — ED Provider Notes (Signed)
[facility]    CSN: [id number] Arrival date & time: 01/20/20  1332      History   Chief Complaint Chief Complaint  Patient presents with  . hemrrhoids    HPI Susan Graves is a 24 y.o. female.   Patient reports that she has had a hemorrhoid for the last 2 days.  Reports that it is very painful, she describes 10 out of 10 pain.  She reports that she cannot sit, walk, and she is afraid to have a bowel movement  She denies that she has made any attempts to treat this at home.  States, "I did not know what to do about it."  Patient reports that she has had a very small hemorrhoid in the past, but nothing that bothers her as much as this one is.  Denies headache, cough, shortness of breath, chills, body aches, nausea, vomiting, diarrhea, rash, fever, other symptoms.  ROS per HPI  The history is provided by the patient.    Past Medical History:  Diagnosis Date  . ADHD     Patient Active Problem List   Diagnosis Date Noted  . Sepsis (HCC) 12/04/2019  . Hypokalemia   . Normocytic anemia   . History of self-harm 12/03/2018    History reviewed. No pertinent surgical history.  OB History   No obstetric history on file.      Home Medications    Prior to Admission medications   Medication Sig Start Date End Date Taking? Authorizing Provider  acetaminophen (TYLENOL) 325 MG tablet Take 2 tablets (650 mg total) by mouth every 6 (six) hours as needed for mild pain (or Fever >/= 101). 12/05/19   Alwyn Ren, MD  bisacodyl (DULCOLAX) 5 MG EC tablet Take 2 tablets (10 mg total) by mouth in the morning and at bedtime. 12/05/19 12/04/20  Alwyn Ren, MD  guaiFENesin-dextromethorphan (ROBITUSSIN DM) 100-10 MG/5ML syrup Take 5 mLs by mouth every 4 (four) hours as needed for cough. 12/05/19   Alwyn Ren, MD  HYDROcodone-acetaminophen (NORCO/VICODIN) 5-325 MG tablet Take 2 tablets by mouth every 4 (four) hours as needed. 01/20/20   Moshe Cipro, NP    ondansetron (ZOFRAN) 4 MG tablet Take 1 tablet (4 mg total) by mouth every 6 (six) hours as needed for nausea. 12/05/19   Alwyn Ren, MD  senna-docusate (SENOKOT-S) 8.6-50 MG tablet Take 2 tablets by mouth 2 (two) times daily. 12/05/19   Alwyn Ren, MD    Family History History reviewed. No pertinent family history.  Social History Social History   Tobacco Use  . Smoking status: Current Every Day Smoker    Packs/day: 1.00    Types: Cigarettes  . Smokeless tobacco: Never Used  Substance Use Topics  . Alcohol use: No  . Drug use: Not Currently    Types: Marijuana     Allergies   Patient has no known allergies.   Review of Systems Review of Systems   Physical Exam Triage Vital Signs ED Triage Vitals [01/20/20 1421]  Enc Vitals Group     BP 94/69     Pulse Rate 99     Resp 20     Temp 98.8 F (37.1 C)     Temp Source Oral     SpO2 98 %     Weight 130 lb (59 kg)     Height 5\' 6"  (1.676 m)     Head Circumference      Peak Flow  Pain Score 10     Pain Loc      Pain Edu?      Excl. in GC?    No data found.  Updated Vital Signs BP 94/69   Pulse 99   Temp 98.8 F (37.1 C) (Oral)   Resp 20   Ht 5\' 6"  (1.676 m)   Wt 130 lb (59 kg)   SpO2 98%   BMI 20.98 kg/m   Visual Acuity Right Eye Distance:   Left Eye Distance:   Bilateral Distance:    Right Eye Near:   Left Eye Near:    Bilateral Near:     Physical Exam Vitals and nursing note reviewed.  Constitutional:      General: She is in acute distress (Patient tearful lying on exam table).     Appearance: Normal appearance. She is well-developed and normal weight.  HENT:     Head: Normocephalic and atraumatic.     Nose: Nose normal.     Mouth/Throat:     Mouth: Mucous membranes are moist.     Pharynx: Oropharynx is clear.  Eyes:     Extraocular Movements: Extraocular movements intact.     Conjunctiva/sclera: Conjunctivae normal.     Pupils: Pupils are equal, round, and  reactive to light.  Cardiovascular:     Rate and Rhythm: Normal rate and regular rhythm.     Heart sounds: Normal heart sounds. No murmur.  Pulmonary:     Effort: Pulmonary effort is normal. No respiratory distress.     Breath sounds: Normal breath sounds. No stridor. No wheezing, rhonchi or rales.  Chest:     Chest wall: No tenderness.  Abdominal:     General: Bowel sounds are normal. There is no distension.     Palpations: Abdomen is soft. There is no mass.     Tenderness: There is no abdominal tenderness. There is no right CVA tenderness, left CVA tenderness, guarding or rebound.     Hernia: No hernia is present.  Genitourinary:    Exam position: Knee-chest position.       Comments: Size and location of hemorrhoid in the red on the diagram above.  Hemorrhoid is about 3 cm x 2 cm. Musculoskeletal:     Cervical back: Normal range of motion and neck supple.  Skin:    General: Skin is warm and dry.  Neurological:     Mental Status: She is alert.      UC Treatments / Results  Labs (all labs ordered are listed, but only abnormal results are displayed) Labs Reviewed - No data to display  EKG   Radiology No results found.  Procedures Procedures (including critical care time)  Medications Ordered in UC Medications  ketorolac (TORADOL) 30 MG/ML injection 30 mg (30 mg Intramuscular Given 01/20/20 1447)    Initial Impression / Assessment and Plan / UC Course  I have reviewed the triage vital signs and the nursing notes.  Pertinent labs & imaging results that were available during my care of the patient were reviewed by me and considered in my medical decision making (see chart for details).     Rectal pain: Upon exam patient has large hemorrhoid to the right side of her rectum externally.  Hemorrhoid measures about 3 cm x 2 cm.  Patient is severely tender to palpation.  Discussed with patient that hemorrhoid is too large to lance in the office.  Patient tearful, but  verbalizes understanding.  Referral and information for [facility] surgery  given to patient.  Instructed patient to call them as soon as she leaves our office, to get her in as soon as possible.  If this does not work out for her, she is instructed that she may go to the ER, but it is likely that she will have to wait to be seen.  Patient verbalizes understanding and agrees with treatment plan.  Also prescribed Norco 5-3 25 mg, 1 to 2 tablets as needed every 8 hours for moderate to severe pain.  Instructed patient that in the meantime she may also get some Preparation H and Tucks pads, or just use witch hazel and a maxi pad for comfort. Final Clinical Impressions(s) / UC Diagnoses   Final diagnoses:  Other hemorrhoids  Rectal pain     Discharge Instructions     You have a large hemorrhoid that will need to be surgically removed.  I have attached information for [facility] [place] surgery.  Give them a call once you leave our office.      ED Prescriptions    Medication Sig Dispense Auth. Provider   HYDROcodone-acetaminophen (NORCO/VICODIN) 5-325 MG tablet Take 2 tablets by mouth every 4 (four) hours as needed. 10 tablet Faustino Congress, NP     I have reviewed the PDMP during this encounter.   Faustino Congress, NP 01/22/20 [phone]

## 2020-01-20 NOTE — ED Triage Notes (Signed)
Pt c/o severe  pain on right side of buttocks; stated it is draining;

## 2020-01-20 NOTE — ED Triage Notes (Addendum)
Pt endorsed that she thinks it hemorrhoids

## 2020-01-20 NOTE — Discharge Instructions (Addendum)
You have a large hemorrhoid that will need to be surgically removed.  I have attached information for [facility].  Give them a call once you leave our office.

## 2020-03-15 ENCOUNTER — Ambulatory Visit: Payer: Medicaid Other

## 2020-03-31 ENCOUNTER — Inpatient Hospital Stay (HOSPITAL_COMMUNITY): Payer: Self-pay | Admitting: Registered Nurse

## 2020-03-31 ENCOUNTER — Other Ambulatory Visit: Payer: Self-pay

## 2020-03-31 ENCOUNTER — Inpatient Hospital Stay (HOSPITAL_COMMUNITY): Payer: Self-pay

## 2020-03-31 ENCOUNTER — Emergency Department (HOSPITAL_COMMUNITY): Payer: Self-pay

## 2020-03-31 ENCOUNTER — Inpatient Hospital Stay (HOSPITAL_COMMUNITY)
Admission: AC | Admit: 2020-03-31 | Discharge: 2020-04-12 | DRG: 957 | Disposition: A | Discharge status: Rehabilitation Facility | Payer: Self-pay | Attending: Surgery | Admitting: Surgery

## 2020-03-31 ENCOUNTER — Encounter (HOSPITAL_COMMUNITY): Admission: AC | Disposition: A | Discharge status: Rehabilitation Facility | Payer: Self-pay | Source: Home / Self Care

## 2020-03-31 DIAGNOSIS — S270XXA Traumatic pneumothorax, initial encounter: Secondary | ICD-10-CM | POA: Diagnosis present

## 2020-03-31 DIAGNOSIS — S81811A Laceration without foreign body, right lower leg, initial encounter: Secondary | ICD-10-CM | POA: Diagnosis present

## 2020-03-31 DIAGNOSIS — S82291B Other fracture of shaft of right tibia, initial encounter for open fracture type I or II: Secondary | ICD-10-CM

## 2020-03-31 DIAGNOSIS — R402362 Coma scale, best motor response, obeys commands, at arrival to emergency department: Secondary | ICD-10-CM | POA: Diagnosis present

## 2020-03-31 DIAGNOSIS — Z23 Encounter for immunization: Secondary | ICD-10-CM

## 2020-03-31 DIAGNOSIS — S3282XA Multiple fractures of pelvis without disruption of pelvic ring, initial encounter for closed fracture: Secondary | ICD-10-CM

## 2020-03-31 DIAGNOSIS — R402142 Coma scale, eyes open, spontaneous, at arrival to emergency department: Secondary | ICD-10-CM | POA: Diagnosis present

## 2020-03-31 DIAGNOSIS — S51012A Laceration without foreign body of left elbow, initial encounter: Secondary | ICD-10-CM | POA: Diagnosis present

## 2020-03-31 DIAGNOSIS — Y92410 Unspecified street and highway as the place of occurrence of the external cause: Secondary | ICD-10-CM

## 2020-03-31 DIAGNOSIS — Y9301 Activity, walking, marching and hiking: Secondary | ICD-10-CM | POA: Diagnosis present

## 2020-03-31 DIAGNOSIS — S82231A Displaced oblique fracture of shaft of right tibia, initial encounter for closed fracture: Principal | ICD-10-CM | POA: Diagnosis present

## 2020-03-31 DIAGNOSIS — S0101XA Laceration without foreign body of scalp, initial encounter: Secondary | ICD-10-CM | POA: Diagnosis present

## 2020-03-31 DIAGNOSIS — T1490XA Injury, unspecified, initial encounter: Secondary | ICD-10-CM

## 2020-03-31 DIAGNOSIS — R509 Fever, unspecified: Secondary | ICD-10-CM | POA: Diagnosis not present

## 2020-03-31 DIAGNOSIS — T148XXA Other injury of unspecified body region, initial encounter: Secondary | ICD-10-CM

## 2020-03-31 DIAGNOSIS — Z20822 Contact with and (suspected) exposure to covid-19: Secondary | ICD-10-CM | POA: Diagnosis present

## 2020-03-31 DIAGNOSIS — S32059A Unspecified fracture of fifth lumbar vertebra, initial encounter for closed fracture: Secondary | ICD-10-CM | POA: Diagnosis present

## 2020-03-31 DIAGNOSIS — S32811A Multiple fractures of pelvis with unstable disruption of pelvic ring, initial encounter for closed fracture: Secondary | ICD-10-CM

## 2020-03-31 DIAGNOSIS — F411 Generalized anxiety disorder: Secondary | ICD-10-CM | POA: Diagnosis present

## 2020-03-31 DIAGNOSIS — D62 Acute posthemorrhagic anemia: Secondary | ICD-10-CM | POA: Diagnosis present

## 2020-03-31 DIAGNOSIS — Z419 Encounter for procedure for purposes other than remedying health state, unspecified: Secondary | ICD-10-CM

## 2020-03-31 DIAGNOSIS — R000 Tachycardia, unspecified: Secondary | ICD-10-CM | POA: Diagnosis not present

## 2020-03-31 DIAGNOSIS — S32049A Unspecified fracture of fourth lumbar vertebra, initial encounter for closed fracture: Secondary | ICD-10-CM | POA: Diagnosis present

## 2020-03-31 DIAGNOSIS — I82402 Acute embolism and thrombosis of unspecified deep veins of left lower extremity: Secondary | ICD-10-CM | POA: Diagnosis present

## 2020-03-31 DIAGNOSIS — S36892A Contusion of other intra-abdominal organs, initial encounter: Secondary | ICD-10-CM | POA: Diagnosis present

## 2020-03-31 DIAGNOSIS — S82201A Unspecified fracture of shaft of right tibia, initial encounter for closed fracture: Secondary | ICD-10-CM

## 2020-03-31 DIAGNOSIS — R202 Paresthesia of skin: Secondary | ICD-10-CM | POA: Diagnosis present

## 2020-03-31 DIAGNOSIS — R578 Other shock: Secondary | ICD-10-CM | POA: Diagnosis present

## 2020-03-31 DIAGNOSIS — S332XXA Dislocation of sacroiliac and sacrococcygeal joint, initial encounter: Secondary | ICD-10-CM | POA: Diagnosis present

## 2020-03-31 DIAGNOSIS — Z9981 Dependence on supplemental oxygen: Secondary | ICD-10-CM

## 2020-03-31 DIAGNOSIS — S32039A Unspecified fracture of third lumbar vertebra, initial encounter for closed fracture: Secondary | ICD-10-CM | POA: Diagnosis present

## 2020-03-31 DIAGNOSIS — S32592A Other specified fracture of left pubis, initial encounter for closed fracture: Secondary | ICD-10-CM | POA: Diagnosis present

## 2020-03-31 DIAGNOSIS — J9690 Respiratory failure, unspecified, unspecified whether with hypoxia or hypercapnia: Secondary | ICD-10-CM | POA: Diagnosis not present

## 2020-03-31 DIAGNOSIS — K5900 Constipation, unspecified: Secondary | ICD-10-CM | POA: Diagnosis not present

## 2020-03-31 DIAGNOSIS — R402242 Coma scale, best verbal response, confused conversation, at arrival to emergency department: Secondary | ICD-10-CM | POA: Diagnosis present

## 2020-03-31 DIAGNOSIS — F17210 Nicotine dependence, cigarettes, uncomplicated: Secondary | ICD-10-CM | POA: Diagnosis present

## 2020-03-31 HISTORY — PX: TIBIA IM NAIL INSERTION: SHX2516

## 2020-03-31 HISTORY — PX: ORIF PELVIC FRACTURE WITH PERCUTANEOUS SCREWS: SHX6800

## 2020-03-31 HISTORY — PX: DEBRIDEMENT AND CLOSURE WOUND: SHX5614

## 2020-03-31 LAB — I-STAT ARTERIAL BLOOD GAS, ED
Acid-base deficit: 11 mmol/L — ABNORMAL HIGH (ref 0.0–2.0)
Bicarbonate: 14.2 mmol/L — ABNORMAL LOW (ref 20.0–28.0)
Calcium, Ion: 1.1 mmol/L — ABNORMAL LOW (ref 1.15–1.40)
HCT: 35 % — ABNORMAL LOW (ref 36.0–46.0)
Hemoglobin: 11.9 g/dL — ABNORMAL LOW (ref 12.0–15.0)
O2 Saturation: 99 %
Patient temperature: 98.6
Potassium: 3.2 mmol/L — ABNORMAL LOW (ref 3.5–5.1)
Sodium: 145 mmol/L (ref 135–145)
TCO2: 15 mmol/L — ABNORMAL LOW (ref 22–32)
pCO2 arterial: 28.9 mmHg — ABNORMAL LOW (ref 32.0–48.0)
pH, Arterial: 7.299 — ABNORMAL LOW (ref 7.350–7.450)
pO2, Arterial: 140 mmHg — ABNORMAL HIGH (ref 83.0–108.0)

## 2020-03-31 LAB — DIC (DISSEMINATED INTRAVASCULAR COAGULATION)PANEL
D-Dimer, Quant: 3.12 ug/mL-FEU — ABNORMAL HIGH (ref 0.00–0.50)
D-Dimer, Quant: 15.81 ug/mL-FEU — ABNORMAL HIGH (ref 0.00–0.50)
Fibrinogen: 197 mg/dL — ABNORMAL LOW (ref 210–475)
Fibrinogen: 259 mg/dL (ref 210–475)
INR: 1.3 — ABNORMAL HIGH (ref 0.8–1.2)
INR: 1.3 — ABNORMAL HIGH (ref 0.8–1.2)
Platelets: 318 10*3/uL (ref 150–400)
Platelets: 188 10*3/uL (ref 150–400)
Prothrombin Time: 15.6 seconds — ABNORMAL HIGH (ref 11.4–15.2)
Prothrombin Time: 15.7 seconds — ABNORMAL HIGH (ref 11.4–15.2)
Smear Review: NONE SEEN
Smear Review: NONE SEEN
aPTT: 22 seconds — ABNORMAL LOW (ref 24–36)
aPTT: 30 seconds (ref 24–36)

## 2020-03-31 LAB — URINALYSIS, ROUTINE W REFLEX MICROSCOPIC
Bilirubin Urine: NEGATIVE
Glucose, UA: NEGATIVE mg/dL
Ketones, ur: NEGATIVE mg/dL
Leukocytes,Ua: NEGATIVE
Nitrite: NEGATIVE
Protein, ur: 100 mg/dL — AB
Specific Gravity, Urine: 1.032 — ABNORMAL HIGH (ref 1.005–1.030)
pH: 5 (ref 5.0–8.0)

## 2020-03-31 LAB — I-STAT CHEM 8, ED
BUN: <3 mg/dL — ABNORMAL LOW (ref 6–20)
Calcium, Ion: 1.05 mmol/L — ABNORMAL LOW (ref 1.15–1.40)
Chloride: 108 mmol/L (ref 98–111)
Creatinine, Ser: 0.9 mg/dL (ref 0.44–1.00)
Glucose, Bld: 128 mg/dL — ABNORMAL HIGH (ref 70–99)
HCT: 32 % — ABNORMAL LOW (ref 36.0–46.0)
Hemoglobin: 10.9 g/dL — ABNORMAL LOW (ref 12.0–15.0)
Potassium: 3.3 mmol/L — ABNORMAL LOW (ref 3.5–5.1)
Sodium: 141 mmol/L (ref 135–145)
TCO2: 17 mmol/L — ABNORMAL LOW (ref 22–32)

## 2020-03-31 LAB — POCT I-STAT 7, (LYTES, BLD GAS, ICA,H+H)
Acid-base deficit: 4 mmol/L — ABNORMAL HIGH (ref 0.0–2.0)
Acid-base deficit: 4 mmol/L — ABNORMAL HIGH (ref 0.0–2.0)
Bicarbonate: 21.4 mmol/L (ref 20.0–28.0)
Bicarbonate: 21.9 mmol/L (ref 20.0–28.0)
Calcium, Ion: 1.08 mmol/L — ABNORMAL LOW (ref 1.15–1.40)
Calcium, Ion: 0.94 mmol/L — ABNORMAL LOW (ref 1.15–1.40)
HCT: 22 % — ABNORMAL LOW (ref 36.0–46.0)
HCT: 26 % — ABNORMAL LOW (ref 36.0–46.0)
Hemoglobin: 7.5 g/dL — ABNORMAL LOW (ref 12.0–15.0)
Hemoglobin: 8.8 g/dL — ABNORMAL LOW (ref 12.0–15.0)
O2 Saturation: 100 %
O2 Saturation: 100 %
Patient temperature: 36.3
Potassium: 4.2 mmol/L (ref 3.5–5.1)
Potassium: 4.2 mmol/L (ref 3.5–5.1)
Sodium: 143 mmol/L (ref 135–145)
Sodium: 143 mmol/L (ref 135–145)
TCO2: 23 mmol/L (ref 22–32)
TCO2: 23 mmol/L (ref 22–32)
pCO2 arterial: 41 mmHg (ref 32.0–48.0)
pCO2 arterial: 43.7 mmHg (ref 32.0–48.0)
pH, Arterial: 7.322 — ABNORMAL LOW (ref 7.350–7.450)
pH, Arterial: 7.307 — ABNORMAL LOW (ref 7.350–7.450)
pO2, Arterial: 222 mmHg — ABNORMAL HIGH (ref 83.0–108.0)
pO2, Arterial: 241 mmHg — ABNORMAL HIGH (ref 83.0–108.0)

## 2020-03-31 LAB — COMPREHENSIVE METABOLIC PANEL
ALT: 29 U/L (ref 0–44)
AST: 58 U/L — ABNORMAL HIGH (ref 15–41)
Albumin: 3.6 g/dL (ref 3.5–5.0)
Alkaline Phosphatase: 55 U/L (ref 38–126)
Anion gap: 17 — ABNORMAL HIGH (ref 5–15)
BUN: <5 mg/dL — ABNORMAL LOW (ref 6–20)
CO2: 15 mmol/L — ABNORMAL LOW (ref 22–32)
Calcium: 8.5 mg/dL — ABNORMAL LOW (ref 8.9–10.3)
Chloride: 108 mmol/L (ref 98–111)
Creatinine, Ser: 0.9 mg/dL (ref 0.44–1.00)
GFR calc Af Amer: >60 mL/min (ref >60)
GFR calc non Af Amer: >60 mL/min (ref >60)
Glucose, Bld: 138 mg/dL — ABNORMAL HIGH (ref 70–99)
Potassium: 3.3 mmol/L — ABNORMAL LOW (ref 3.5–5.1)
Sodium: 140 mmol/L (ref 135–145)
Total Bilirubin: 0.4 mg/dL (ref 0.3–1.2)
Total Protein: 6.8 g/dL (ref 6.5–8.1)

## 2020-03-31 LAB — PROTIME-INR
INR: 1.3 — ABNORMAL HIGH (ref 0.8–1.2)
Prothrombin Time: 15.3 seconds — ABNORMAL HIGH (ref 11.4–15.2)

## 2020-03-31 LAB — CBC
HCT: 35.4 % — ABNORMAL LOW (ref 36.0–46.0)
HCT: 34.6 % — ABNORMAL LOW (ref 36.0–46.0)
Hemoglobin: 10.9 g/dL — ABNORMAL LOW (ref 12.0–15.0)
Hemoglobin: 11.1 g/dL — ABNORMAL LOW (ref 12.0–15.0)
MCH: 24.4 pg — ABNORMAL LOW (ref 26.0–34.0)
MCH: 26.5 pg (ref 26.0–34.0)
MCHC: 30.8 g/dL (ref 30.0–36.0)
MCHC: 32.1 g/dL (ref 30.0–36.0)
MCV: 79.4 fL — ABNORMAL LOW (ref 80.0–100.0)
MCV: 82.6 fL (ref 80.0–100.0)
Platelets: 323 10*3/uL (ref 150–400)
Platelets: 134 10*3/uL — ABNORMAL LOW (ref 150–400)
RBC: 4.46 MIL/uL (ref 3.87–5.11)
RBC: 4.19 MIL/uL (ref 3.87–5.11)
RDW: 15.8 % — ABNORMAL HIGH (ref 11.5–15.5)
RDW: 16.6 % — ABNORMAL HIGH (ref 11.5–15.5)
WBC: 4.3 10*3/uL (ref 4.0–10.5)
WBC: 11.8 10*3/uL — ABNORMAL HIGH (ref 4.0–10.5)
nRBC: 0 % (ref 0.0–0.2)
nRBC: 0 % (ref 0.0–0.2)

## 2020-03-31 LAB — HCG, QUANTITATIVE, PREGNANCY: hCG, Beta Chain, Quant, S: 1 m[IU]/mL (ref <5)

## 2020-03-31 LAB — HEMOGLOBIN AND HEMATOCRIT, BLOOD
HCT: 35.2 % — ABNORMAL LOW (ref 36.0–46.0)
Hemoglobin: 11.1 g/dL — ABNORMAL LOW (ref 12.0–15.0)

## 2020-03-31 LAB — PREPARE CRYOPRECIPITATE: Unit division: 0

## 2020-03-31 LAB — LACTIC ACID, PLASMA
Lactic Acid, Venous: 6 mmol/L (ref 0.5–1.9)
Lactic Acid, Venous: 3.5 mmol/L (ref 0.5–1.9)

## 2020-03-31 LAB — SARS CORONAVIRUS 2 BY RT PCR (HOSPITAL ORDER, PERFORMED IN ~~LOC~~ HOSPITAL LAB): SARS Coronavirus 2: NEGATIVE

## 2020-03-31 LAB — BPAM CRYOPRECIPITATE
Blood Product Expiration Date: 202106171245
Unit Type and Rh: 5100

## 2020-03-31 LAB — ABO/RH: ABO/RH(D): O POS

## 2020-03-31 LAB — PREPARE RBC (CROSSMATCH)

## 2020-03-31 LAB — MASSIVE TRANSFUSION PROTOCOL ORDER (BLOOD BANK NOTIFICATION)

## 2020-03-31 LAB — ETHANOL: Alcohol, Ethyl (B): 159 mg/dL — ABNORMAL HIGH (ref <10)

## 2020-03-31 LAB — MRSA PCR SCREENING: MRSA by PCR: NEGATIVE

## 2020-03-31 SURGERY — CLOSED REDUCTION, PELVIS, WITH PERCUTANEOUS FIXATION
Anesthesia: General | Site: Arm Lower | Laterality: Right

## 2020-03-31 MED ORDER — CEFAZOLIN SODIUM-DEXTROSE 2-4 GM/100ML-% IV SOLN
2.0000 g | Freq: Three times a day (TID) | INTRAVENOUS | Status: AC
  Administered 2020-03-31 – 2020-04-01 (×3): 2 g via INTRAVENOUS
  Filled 2020-03-31 (×3): qty 100

## 2020-03-31 MED ORDER — FENTANYL CITRATE (PF) 100 MCG/2ML IJ SOLN
INTRAMUSCULAR | Status: AC
  Filled 2020-03-31: qty 2

## 2020-03-31 MED ORDER — CHLORHEXIDINE GLUCONATE CLOTH 2 % EX PADS
6.0000 | MEDICATED_PAD | Freq: Every day | CUTANEOUS | Status: DC
  Administered 2020-03-31 – 2020-04-08 (×9): 6 via TOPICAL

## 2020-03-31 MED ORDER — DEXAMETHASONE SODIUM PHOSPHATE 10 MG/ML IJ SOLN
INTRAMUSCULAR | Status: AC
  Filled 2020-03-31: qty 1

## 2020-03-31 MED ORDER — PHENYLEPHRINE 40 MCG/ML (10ML) SYRINGE FOR IV PUSH (FOR BLOOD PRESSURE SUPPORT)
PREFILLED_SYRINGE | INTRAVENOUS | Status: DC | PRN
  Administered 2020-03-31 (×2): 80 ug via INTRAVENOUS

## 2020-03-31 MED ORDER — ALBUMIN HUMAN 5 % IV SOLN
INTRAVENOUS | Status: DC | PRN
Start: 2020-03-31 — End: 2020-03-31

## 2020-03-31 MED ORDER — SODIUM CHLORIDE 0.9% IV SOLUTION: Freq: Once | INTRAVENOUS | Status: DC

## 2020-03-31 MED ORDER — FENTANYL BOLUS VIA INFUSION
50.0000 ug | INTRAVENOUS | Status: DC | PRN
  Filled 2020-03-31: qty 50

## 2020-03-31 MED ORDER — MIDAZOLAM HCL 2 MG/2ML IJ SOLN
INTRAMUSCULAR | Status: AC
  Filled 2020-03-31: qty 2

## 2020-03-31 MED ORDER — ONDANSETRON HCL 4 MG/2ML IJ SOLN
INTRAMUSCULAR | Status: AC
  Filled 2020-03-31: qty 2

## 2020-03-31 MED ORDER — PROPOFOL 10 MG/ML IV BOLUS
INTRAVENOUS | Status: DC | PRN
  Administered 2020-03-31 (×2): 30 mg via INTRAVENOUS

## 2020-03-31 MED ORDER — FENTANYL CITRATE (PF) 100 MCG/2ML IJ SOLN
INTRAMUSCULAR | Status: AC | PRN
  Administered 2020-03-31: 75 ug via INTRAVENOUS

## 2020-03-31 MED ORDER — HYDROMORPHONE HCL 1 MG/ML IJ SOLN
INTRAMUSCULAR | Status: AC
  Filled 2020-03-31: qty 0.5

## 2020-03-31 MED ORDER — LACTATED RINGERS IV SOLN: INTRAVENOUS | Status: DC

## 2020-03-31 MED ORDER — ROCURONIUM BROMIDE 10 MG/ML (PF) SYRINGE
PREFILLED_SYRINGE | INTRAVENOUS | Status: AC
  Filled 2020-03-31: qty 10

## 2020-03-31 MED ORDER — HYDROMORPHONE HCL 1 MG/ML IJ SOLN
INTRAMUSCULAR | Status: DC | PRN
  Administered 2020-03-31: .5 mg via INTRAVENOUS

## 2020-03-31 MED ORDER — PROPOFOL 1000 MG/100ML IV EMUL
5.0000 ug/kg/min | INTRAVENOUS | Status: DC
  Administered 2020-03-31: 50 ug/kg/min via INTRAVENOUS
  Administered 2020-03-31: 10 ug/kg/min via INTRAVENOUS
  Administered 2020-04-01: 50 ug/kg/min via INTRAVENOUS
  Filled 2020-03-31 (×4): qty 100

## 2020-03-31 MED ORDER — VANCOMYCIN HCL 1000 MG IV SOLR
INTRAVENOUS | Status: DC | PRN
  Administered 2020-03-31: 1000 mg via TOPICAL

## 2020-03-31 MED ORDER — PROPOFOL 1000 MG/100ML IV EMUL
INTRAVENOUS | Status: AC
  Administered 2020-03-31: 70 ug/kg/min via INTRAVENOUS
  Filled 2020-03-31: qty 100

## 2020-03-31 MED ORDER — FENTANYL CITRATE (PF) 250 MCG/5ML IJ SOLN
INTRAMUSCULAR | Status: AC
  Filled 2020-03-31: qty 5

## 2020-03-31 MED ORDER — DEXAMETHASONE SODIUM PHOSPHATE 10 MG/ML IJ SOLN
INTRAMUSCULAR | Status: DC | PRN
  Administered 2020-03-31: 5 mg via INTRAVENOUS

## 2020-03-31 MED ORDER — CEFAZOLIN SODIUM-DEXTROSE 2-3 GM-%(50ML) IV SOLR
INTRAVENOUS | Status: DC | PRN
Start: 2020-03-31 — End: 2020-03-31
  Administered 2020-03-31: 2 g via INTRAVENOUS

## 2020-03-31 MED ORDER — CEFAZOLIN SODIUM 1 G IJ SOLR
INTRAMUSCULAR | Status: AC
  Filled 2020-03-31: qty 20

## 2020-03-31 MED ORDER — ALBUMIN HUMAN 5 % IV SOLN
25.0000 g | Freq: Once | INTRAVENOUS | Status: AC
  Administered 2020-03-31: 25 g via INTRAVENOUS

## 2020-03-31 MED ORDER — IOHEXOL 300 MG/ML  SOLN
100.0000 mL | Freq: Once | INTRAMUSCULAR | Status: AC | PRN
  Administered 2020-03-31: 100 mL via INTRAVENOUS

## 2020-03-31 MED ORDER — TETANUS-DIPHTH-ACELL PERTUSSIS 5-2.5-18.5 LF-MCG/0.5 IM SUSP
0.5000 mL | Freq: Once | INTRAMUSCULAR | Status: AC
  Administered 2020-03-31: 0.5 mL via INTRAMUSCULAR
  Filled 2020-03-31: qty 0.5

## 2020-03-31 MED ORDER — FENTANYL 2500MCG IN NS 250ML (10MCG/ML) PREMIX INFUSION
0.0000 ug/h | INTRAVENOUS | Status: DC
  Administered 2020-03-31 (×2): 100 ug/h via INTRAVENOUS
  Administered 2020-04-01: 200 ug/h via INTRAVENOUS
  Filled 2020-03-31 (×2): qty 250

## 2020-03-31 MED ORDER — PROPOFOL 10 MG/ML IV BOLUS
INTRAVENOUS | Status: AC
  Filled 2020-03-31: qty 20

## 2020-03-31 MED ORDER — 0.9 % SODIUM CHLORIDE (POUR BTL) OPTIME
TOPICAL | Status: DC | PRN
  Administered 2020-03-31: 1000 mL

## 2020-03-31 MED ORDER — CHLORHEXIDINE GLUCONATE 0.12% ORAL RINSE (MEDLINE KIT)
15.0000 mL | Freq: Two times a day (BID) | OROMUCOSAL | Status: DC
  Administered 2020-03-31 – 2020-04-01 (×4): 15 mL via OROMUCOSAL

## 2020-03-31 MED ORDER — MIDAZOLAM HCL 5 MG/5ML IJ SOLN
INTRAMUSCULAR | Status: DC | PRN
  Administered 2020-03-31: 2 mg via INTRAVENOUS

## 2020-03-31 MED ORDER — PHENYLEPHRINE 40 MCG/ML (10ML) SYRINGE FOR IV PUSH (FOR BLOOD PRESSURE SUPPORT)
PREFILLED_SYRINGE | INTRAVENOUS | Status: AC
  Filled 2020-03-31: qty 10

## 2020-03-31 MED ORDER — WHITE PETROLATUM EX OINT
TOPICAL_OINTMENT | CUTANEOUS | Status: AC
  Filled 2020-03-31: qty 28.35

## 2020-03-31 MED ORDER — ORAL CARE MOUTH RINSE
15.0000 mL | OROMUCOSAL | Status: DC
  Administered 2020-03-31 – 2020-04-01 (×7): 15 mL via OROMUCOSAL

## 2020-03-31 MED ORDER — VANCOMYCIN HCL 1000 MG IV SOLR
INTRAVENOUS | Status: AC
  Filled 2020-03-31: qty 1000

## 2020-03-31 MED ORDER — POTASSIUM CHLORIDE 10 MEQ/100ML IV SOLN: 10.0000 meq | INTRAVENOUS | Status: DC

## 2020-03-31 MED ORDER — ALBUMIN HUMAN 5 % IV SOLN
INTRAVENOUS | Status: AC
  Filled 2020-03-31: qty 500

## 2020-03-31 MED ORDER — CEFAZOLIN SODIUM-DEXTROSE 2-4 GM/100ML-% IV SOLN
2.0000 g | Freq: Once | INTRAVENOUS | Status: AC
  Administered 2020-03-31: 2 g via INTRAVENOUS
  Filled 2020-03-31: qty 100

## 2020-03-31 MED ORDER — FENTANYL CITRATE (PF) 250 MCG/5ML IJ SOLN
INTRAMUSCULAR | Status: DC | PRN
  Administered 2020-03-31: 100 ug via INTRAVENOUS

## 2020-03-31 MED ORDER — ROCURONIUM BROMIDE 50 MG/5ML IV SOLN
INTRAVENOUS | Status: AC | PRN
  Administered 2020-03-31: 80 mg via INTRAVENOUS

## 2020-03-31 MED ORDER — ETOMIDATE 2 MG/ML IV SOLN
INTRAVENOUS | Status: AC | PRN
  Administered 2020-03-31: 20 mg via INTRAVENOUS

## 2020-03-31 MED ORDER — LACTATED RINGERS IV SOLN: INTRAVENOUS | Status: DC | PRN

## 2020-03-31 MED ORDER — POTASSIUM CHLORIDE 10 MEQ/100ML IV SOLN
10.0000 meq | INTRAVENOUS | Status: AC
  Administered 2020-03-31 (×4): 10 meq via INTRAVENOUS
  Filled 2020-03-31 (×6): qty 100

## 2020-03-31 MED ORDER — ROCURONIUM BROMIDE 10 MG/ML (PF) SYRINGE
PREFILLED_SYRINGE | INTRAVENOUS | Status: DC | PRN
  Administered 2020-03-31 (×2): 50 mg via INTRAVENOUS

## 2020-03-31 SURGICAL SUPPLY — 90 items
BIT DRILL CAL 3.2 LONG (BIT) ×4 IMPLANT
BIT DRILL CAL 3.2MM LONG (BIT) ×1
BIT DRILL CANN 4.5MM (BIT) ×6 IMPLANT
BIT DRILL SHORT 3.2MM (DRILL) ×6 IMPLANT
BLADE CLIPPER SURG (BLADE) IMPLANT
BLADE SURG 10 STRL SS (BLADE) ×10 IMPLANT
BLADE SURG 11 STRL SS (BLADE) ×5 IMPLANT
BNDG COHESIVE 4X5 TAN STRL (GAUZE/BANDAGES/DRESSINGS) ×5 IMPLANT
BNDG ELASTIC 4X5.8 VLCR NS LF (GAUZE/BANDAGES/DRESSINGS) ×5 IMPLANT
BNDG ELASTIC 4X5.8 VLCR STR LF (GAUZE/BANDAGES/DRESSINGS) ×5 IMPLANT
BNDG ELASTIC 6X5.8 VLCR STR LF (GAUZE/BANDAGES/DRESSINGS) ×5 IMPLANT
BNDG GAUZE ELAST 4 BULKY (GAUZE/BANDAGES/DRESSINGS) ×5 IMPLANT
BRUSH SCRUB EZ PLAIN DRY (MISCELLANEOUS) ×10 IMPLANT
CHLORAPREP W/TINT 26 (MISCELLANEOUS) ×5 IMPLANT
CLOSURE WOUND 1/2 X4 (GAUZE/BANDAGES/DRESSINGS) ×1
COVER SURGICAL LIGHT HANDLE (MISCELLANEOUS) ×10 IMPLANT
COVER WAND RF STERILE (DRAPES) ×5 IMPLANT
DERMABOND ADVANCED (GAUZE/BANDAGES/DRESSINGS) ×2
DERMABOND ADVANCED .7 DNX12 (GAUZE/BANDAGES/DRESSINGS) ×3 IMPLANT
DRAPE C-ARM 42X72 X-RAY (DRAPES) ×10 IMPLANT
DRAPE C-ARMOR (DRAPES) ×5 IMPLANT
DRAPE HALF SHEET 40X57 (DRAPES) ×10 IMPLANT
DRAPE IMP U-DRAPE 54X76 (DRAPES) ×10 IMPLANT
DRAPE INCISE IOBAN 66X45 STRL (DRAPES) ×5 IMPLANT
DRAPE ORTHO SPLIT 77X108 STRL (DRAPES) ×10
DRAPE SURG 17X23 STRL (DRAPES) ×30 IMPLANT
DRAPE SURG ORHT 6 SPLT 77X108 (DRAPES) ×6 IMPLANT
DRAPE U-SHAPE 47X51 STRL (DRAPES) ×5 IMPLANT
DRILL BIT CANN 4.5MM (BIT) ×4
DRILL SHORT 3.2MM (DRILL) ×10
DRSG ADAPTIC 3X8 NADH LF (GAUZE/BANDAGES/DRESSINGS) ×5 IMPLANT
DRSG MEPILEX BORDER 4X4 (GAUZE/BANDAGES/DRESSINGS) ×10 IMPLANT
DRSG MEPILEX BORDER 4X8 (GAUZE/BANDAGES/DRESSINGS) ×5 IMPLANT
DRSG MEPITEL 4X7.2 (GAUZE/BANDAGES/DRESSINGS) ×5 IMPLANT
DRSG TEGADERM 4X4.75 (GAUZE/BANDAGES/DRESSINGS) ×5 IMPLANT
ELECT REM PT RETURN 9FT ADLT (ELECTROSURGICAL) ×5
ELECTRODE REM PT RTRN 9FT ADLT (ELECTROSURGICAL) ×3 IMPLANT
GAUZE SPONGE 4X4 12PLY STRL (GAUZE/BANDAGES/DRESSINGS) ×15 IMPLANT
GLOVE BIO SURGEON STRL SZ 6.5 (GLOVE) ×12 IMPLANT
GLOVE BIO SURGEON STRL SZ7.5 (GLOVE) ×30 IMPLANT
GLOVE BIO SURGEONS STRL SZ 6.5 (GLOVE) ×3
GLOVE BIOGEL PI IND STRL 6.5 (GLOVE) ×3 IMPLANT
GLOVE BIOGEL PI IND STRL 7.5 (GLOVE) ×6 IMPLANT
GLOVE BIOGEL PI INDICATOR 6.5 (GLOVE) ×2
GLOVE BIOGEL PI INDICATOR 7.5 (GLOVE) ×4
GOWN STRL REUS W/ TWL LRG LVL3 (GOWN DISPOSABLE) ×15 IMPLANT
GOWN STRL REUS W/TWL LRG LVL3 (GOWN DISPOSABLE) ×25
GUIDEWIRE 2.0MM (WIRE) ×20 IMPLANT
GUIDEWIRE 3.2X400 (WIRE) ×5 IMPLANT
GUIDEWIRE THREADED 2.8MM (WIRE) ×15 IMPLANT
KIT BASIN OR (CUSTOM PROCEDURE TRAY) ×5 IMPLANT
KIT TURNOVER KIT B (KITS) ×5 IMPLANT
MANIFOLD NEPTUNE II (INSTRUMENTS) ×5 IMPLANT
NAIL TIBIAL EX 9X330MM (Nail) ×5 IMPLANT
NS IRRIG 1000ML POUR BTL (IV SOLUTION) ×5 IMPLANT
PACK TOTAL JOINT (CUSTOM PROCEDURE TRAY) ×5 IMPLANT
PACK UNIVERSAL I (CUSTOM PROCEDURE TRAY) ×5 IMPLANT
PAD ARMBOARD 7.5X6 YLW CONV (MISCELLANEOUS) ×10 IMPLANT
PAD CAST 4YDX4 CTTN HI CHSV (CAST SUPPLIES) ×3 IMPLANT
PADDING CAST COTTON 4X4 STRL (CAST SUPPLIES) ×5
PADDING CAST COTTON 6X4 STRL (CAST SUPPLIES) ×5 IMPLANT
REAMER ROD DEEP FLUTE 2.5X950 (INSTRUMENTS) ×5 IMPLANT
SCREW  LOCK CANN 6.5X130 (Screw) ×5 IMPLANT
SCREW CANN 6.5X135X32 (Screw) ×5 IMPLANT
SCREW CANN THRD 6.5X125 (Screw) ×5 IMPLANT
SCREW LOCK 4X36 TI (Screw) ×10 IMPLANT
SCREW LOCK 4X64 TI (Screw) ×5 IMPLANT
SCREW LOCK CANN 6.5X130 (Screw) ×3 IMPLANT
SCREW LOCK T25 FT 32X4X3.3X (Screw) ×3 IMPLANT
SCREW LOCKING 4X32 (Screw) ×5 IMPLANT
SCREW SHANZ 4.0X150 (Screw) ×5 IMPLANT
SPONGE LAP 18X18 RF (DISPOSABLE) IMPLANT
STAPLER VISISTAT 35W (STAPLE) ×5 IMPLANT
STRIP CLOSURE SKIN 1/2X4 (GAUZE/BANDAGES/DRESSINGS) ×4 IMPLANT
SUCTION FRAZIER HANDLE 10FR (MISCELLANEOUS) ×5
SUCTION TUBE FRAZIER 10FR DISP (MISCELLANEOUS) ×3 IMPLANT
SUT MNCRL AB 3-0 PS2 18 (SUTURE) ×10 IMPLANT
SUT MNCRL+ AB 3-0 CT1 36 (SUTURE) ×3 IMPLANT
SUT MON AB 2-0 CT1 36 (SUTURE) ×5 IMPLANT
SUT MONOCRYL AB 3-0 CT1 36IN (SUTURE) ×5
SUT VIC AB 0 CT1 27 (SUTURE)
SUT VIC AB 0 CT1 27XBRD ANBCTR (SUTURE) IMPLANT
SUT VIC AB 2-0 CT1 27 (SUTURE)
SUT VIC AB 2-0 CT1 TAPERPNT 27 (SUTURE) IMPLANT
TOWEL GREEN STERILE (TOWEL DISPOSABLE) ×10 IMPLANT
TOWEL GREEN STERILE FF (TOWEL DISPOSABLE) ×5 IMPLANT
TRAY FOLEY MTR SLVR 16FR STAT (SET/KITS/TRAYS/PACK) IMPLANT
WASHER FOR 5.0 SCREWS (Washer) ×10 IMPLANT
WATER STERILE IRR 1000ML POUR (IV SOLUTION) ×5 IMPLANT
YANKAUER SUCT BULB TIP NO VENT (SUCTIONS) IMPLANT
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
## 2020-03-31 NOTE — Anesthesia Procedure Notes (Signed)
Arterial Line Insertion Start/End6/17/2021 4:21 PM Performed by: Laruth Bouchard., CRNA, CRNA  Patient location: OR. Preanesthetic checklist: patient identified, IV checked, site marked, risks and benefits discussed, surgical consent, monitors and equipment checked, pre-op evaluation, timeout performed and anesthesia consent Patient sedated Right, radial was placed Catheter size: 20 G Hand hygiene performed  and maximum sterile barriers used   Attempts: 1 Procedure performed without using ultrasound guided technique. Following insertion, dressing applied and Biopatch. Post procedure assessment: normal  Patient tolerated the procedure well with no immediate complications.
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
## 2020-03-31 NOTE — Anesthesia Preprocedure Evaluation (Signed)
Anesthesia Evaluation  Patient identified by MRN, date of birth, ID band Patient unresponsive    Reviewed: Allergy & Precautions, NPO status , Patient's Chart, lab work & pertinent test results, Unable to perform ROS - Chart review onlyPreop documentation limited or incomplete due to emergent nature of procedure.  History of Anesthesia Complications Negative for: history of anesthetic complications  Airway Mallampati: Intubated       Dental   Pulmonary           Cardiovascular      Neuro/Psych    GI/Hepatic   Endo/Other    Renal/GU      Musculoskeletal   Abdominal   Peds  Hematology   Anesthesia Other Findings Vertical shear pelvic injury, Left tib/fib fx,  Right forearm avulsion with soft tissue defect, Jejunal mesenteric contusion, L3-L5 TP fxs, R Ptx   Reproductive/Obstetrics                             Anesthesia Physical Anesthesia Plan  ASA: II  Anesthesia Plan: General   Post-op Pain Management:    Induction: Inhalational  PONV Risk Score and Plan: 3 and Treatment may vary due to age or medical condition  Airway Management Planned: Oral ETT  Additional Equipment:   Intra-op Plan:   Post-operative Plan:   Informed Consent:     Only emergency history available and Consent reviewed with POA  Plan Discussed with: Surgeon and CRNA  Anesthesia Plan Comments: (Possible a line)        Anesthesia Quick Evaluation

## 2020-03-31 NOTE — Progress Notes (Signed)
Orthopedic Tech Progress Note Patient Details:  Susan Graves 1996-01-12 [id number] AFO has been ordered  Patient ID: [name], female   DOB: August 30, 1996, 24 y.o.   MRN: [id number]   Smitty Pluck 03/31/2020, 7:44 PM

## 2020-03-31 NOTE — Procedures (Signed)
Operative note  Preoperative diagnosis: Complex scalp laceration left side 7 cm Postoperative diagnosis: Complex scalp laceration left side 7 cm Procedure: Irrigation and closure of complex scalp laceration 7 cm Surgeon: [name] Consent: Emergency, we have still not been able to reach family and she is on the ventilator Procedure in detail: Her hair was trimmed with a clipper.  I then thoroughly irrigated and palpated the wound.  I did not feel any skull fracture.  The wound was closed in simple fashion with staples.  No complications.  [name], MD, MPH, FACS Please use [email or web address] to contact on call provider

## 2020-03-31 NOTE — Progress Notes (Signed)
Orthopedic Tech Progress Note Patient Details:  Susan Graves 03-Jan-1996 [id number]  Ortho Devices Type of Ortho Device: Post (short leg) splint Ortho Device/Splint Location: Right Lower Extremity Ortho Device/Splint Interventions: Ordered, Application   Post Interventions Patient Tolerated: Well  Musculoskeletal Traction Type of Traction: Skeletal (Balanced Suspension) Traction Location: Left lower extremity Traction Weight: 20 lbs   Post Interventions Patient Tolerated: Well  Was instructed by PA to apply 20 pounds to skeletal traction device . Gerald Stabs 03/31/2020, 9:11 AM
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
## 2020-03-31 NOTE — Procedures (Signed)
Procedure: Left tibia traction pin placement  Indication: Left pelvic fracture  Surgeon: Charma Igo, PA-C  Assist: Ulyses Southward, PA-C  Anesthesia: Propofol  EBL: None  Complications: None  Findings: Procedure done as emergency as pt intubated and could not consent and no family available. I was called to assist as Maralyn Sago had trouble placing femoral traction pin. I suggested moving to tibia. The area was sterilely prepped. K-wire placed through the tibial tuberosity. Traction bow applied. Pt was pending movement to hospital bed so that traction could be applied. Pt tolerated the procedure well.    Freeman Caldron, PA-C Orthopedic Surgery [phone]
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
## 2020-03-31 NOTE — Transfer of Care (Signed)
Immediate Anesthesia Transfer of Care Note  Patient: Susan Graves  Procedure(s) Performed: ORIF PELVIC FRACTURE WITH PERCUTANEOUS SCREWS (Left ) INTRAMEDULLARY (IM) NAIL TIBIAL (Right ) DEBRIDEMENT AND CLOSURE WOUND (Left Arm Lower)  Patient Location: ICU  Anesthesia Type:General  Level of Consciousness: Patient remains intubated per anesthesia plan  Airway & Oxygen Therapy: Patient remains intubated per anesthesia plan and Patient placed on Ventilator (see vital sign flow sheet for setting)  Post-op Assessment: Report given to RN and Post -op Vital signs reviewed and stable  Post vital signs: Reviewed and stable  Last Vitals:  Vitals Value Taken Time  BP 119/101 03/31/20 1900  Temp    Pulse 83 03/31/20 1914  Resp 16 03/31/20 1914  SpO2 100 % 03/31/20 1914  Vitals shown include unvalidated device data.  Last Pain:  Vitals:   03/31/20 1200  TempSrc: Axillary         Complications: No complications documented.

## 2020-03-31 NOTE — Progress Notes (Signed)
Assist with transport to ICU on vent. No noted respiratory issues.

## 2020-03-31 NOTE — Progress Notes (Signed)
Pt belongings on arrival to 4NICU:  1 stud earring  clothing  Pt drivers license  1 black netspend liberty tax credit card w/ name [employer] Customer  1 black netspend liberty tax credit card w/ name AMAND LEMOINE  1 black and green unlimitied VISA debit card w/ name Boston Catarino  1 american flag ebt card w/ name Venora Maples
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
## 2020-03-31 NOTE — Progress Notes (Signed)
I was able to discuss the patient's care with her mother over the phone.  I discussed risks and benefits of proceeding with fixation of her pelvis and tibia.  Her resuscitation markers have improved.  However she still has a lactate of 3.5.  I will proceed with fixation of her pelvis as I feel that this is most urgent to provide her hemodynamic stability.  If she is doing well we will plan for I&D and intramedullary nailing of right tibia fracture versus possible exfix depending on her intraoperative stability.  Consent was obtained over the phone.  Weightbearing status will be updated postoperatively.  Roby Lofts, MD [facility] [phone] (office) [email or web address]

## 2020-03-31 NOTE — Anesthesia Postprocedure Evaluation (Signed)
Anesthesia Post Note  Patient: Megyn Leng  Procedure(s) Performed: ORIF PELVIC FRACTURE WITH PERCUTANEOUS SCREWS (Left ) INTRAMEDULLARY (IM) NAIL TIBIAL (Right ) DEBRIDEMENT AND CLOSURE WOUND (Left Arm Lower)     Patient location during evaluation: SICU Anesthesia Type: General Level of consciousness: sedated Pain management: pain level controlled Vital Signs Assessment: post-procedure vital signs reviewed and stable Respiratory status: patient remains intubated per anesthesia plan Cardiovascular status: stable Postop Assessment: no apparent nausea or vomiting Anesthetic complications: no   No complications documented.  Last Vitals:  Vitals:   03/31/20 2000 03/31/20 2022  BP: 114/84 128/71  Pulse: 81 81  Resp: 15 15  Temp: 36.5 C   SpO2:  100%    Last Pain:  Vitals:   03/31/20 2000  TempSrc: Oral                 Johnnay Pleitez,W. EDMOND
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
## 2020-04-01 ENCOUNTER — Encounter (HOSPITAL_COMMUNITY): Payer: Self-pay | Admitting: Student

## 2020-04-01 ENCOUNTER — Inpatient Hospital Stay (HOSPITAL_COMMUNITY): Payer: Self-pay

## 2020-04-01 DIAGNOSIS — F411 Generalized anxiety disorder: Secondary | ICD-10-CM

## 2020-04-01 LAB — PREPARE PLATELET PHERESIS: Unit division: 0

## 2020-04-01 LAB — SARS CORONAVIRUS 2 BY RT PCR (HOSPITAL ORDER, PERFORMED IN ~~LOC~~ HOSPITAL LAB): SARS Coronavirus 2: NEGATIVE

## 2020-04-01 LAB — BASIC METABOLIC PANEL
Anion gap: 8 (ref 5–15)
BUN: <5 mg/dL — ABNORMAL LOW (ref 6–20)
CO2: 22 mmol/L (ref 22–32)
Calcium: 7.4 mg/dL — ABNORMAL LOW (ref 8.9–10.3)
Chloride: 110 mmol/L (ref 98–111)
Creatinine, Ser: 0.61 mg/dL (ref 0.44–1.00)
GFR calc Af Amer: >60 mL/min (ref >60)
GFR calc non Af Amer: >60 mL/min (ref >60)
Glucose, Bld: 90 mg/dL (ref 70–99)
Potassium: 3.3 mmol/L — ABNORMAL LOW (ref 3.5–5.1)
Sodium: 140 mmol/L (ref 135–145)

## 2020-04-01 LAB — PREPARE FRESH FROZEN PLASMA
Unit division: 0
Unit division: 0

## 2020-04-01 LAB — CBC
HCT: 26.5 % — ABNORMAL LOW (ref 36.0–46.0)
Hemoglobin: 8.5 g/dL — ABNORMAL LOW (ref 12.0–15.0)
MCH: 26.1 pg (ref 26.0–34.0)
MCHC: 32.1 g/dL (ref 30.0–36.0)
MCV: 81.3 fL (ref 80.0–100.0)
Platelets: 81 10*3/uL — ABNORMAL LOW (ref 150–400)
RBC: 3.26 MIL/uL — ABNORMAL LOW (ref 3.87–5.11)
RDW: 17.2 % — ABNORMAL HIGH (ref 11.5–15.5)
WBC: 9.3 10*3/uL (ref 4.0–10.5)
nRBC: 0 % (ref 0.0–0.2)

## 2020-04-01 LAB — BPAM PLATELET PHERESIS
Blood Product Expiration Date: 202106172359
ISSUE DATE / TIME: 202106171528
Unit Type and Rh: 5100

## 2020-04-01 LAB — BPAM FFP
Blood Product Expiration Date: 202106222359
Blood Product Expiration Date: 202106222359
ISSUE DATE / TIME: 202106171652
ISSUE DATE / TIME: 202106171652
Unit Type and Rh: 5100
Unit Type and Rh: 5100

## 2020-04-01 LAB — TRIGLYCERIDES: Triglycerides: 170 mg/dL — ABNORMAL HIGH (ref <150)

## 2020-04-01 LAB — VITAMIN D 25 HYDROXY (VIT D DEFICIENCY, FRACTURES): Vit D, 25-Hydroxy: 16.34 ng/mL — ABNORMAL LOW (ref 30–100)

## 2020-04-01 MED ORDER — LACTATED RINGERS IV BOLUS
1000.0000 mL | Freq: Once | INTRAVENOUS | Status: AC
  Administered 2020-04-01: 1000 mL via INTRAVENOUS

## 2020-04-01 MED ORDER — BUSPIRONE HCL 10 MG PO TABS: 5.0000 mg | ORAL_TABLET | Freq: Three times a day (TID) | ORAL | Status: DC

## 2020-04-01 MED ORDER — BUSPIRONE HCL 5 MG PO TABS
5.0000 mg | ORAL_TABLET | Freq: Three times a day (TID) | ORAL | Status: DC
  Administered 2020-04-02 – 2020-04-10 (×19): 5 mg via ORAL
  Filled 2020-04-01 (×26): qty 1

## 2020-04-01 MED ORDER — MORPHINE SULFATE (PF) 4 MG/ML IV SOLN
4.0000 mg | INTRAVENOUS | Status: DC | PRN
  Administered 2020-04-01 – 2020-04-05 (×10): 4 mg via INTRAVENOUS
  Filled 2020-04-01 (×10): qty 1

## 2020-04-01 MED ORDER — METHOCARBAMOL 500 MG PO TABS
1000.0000 mg | ORAL_TABLET | Freq: Three times a day (TID) | ORAL | Status: DC
  Administered 2020-04-01 – 2020-04-10 (×27): 1000 mg via ORAL
  Filled 2020-04-01 (×30): qty 2

## 2020-04-01 MED ORDER — LORAZEPAM 2 MG/ML IJ SOLN
INTRAMUSCULAR | Status: AC
  Filled 2020-04-01: qty 1

## 2020-04-01 MED ORDER — OXYCODONE HCL 5 MG/5ML PO SOLN: 5.0000 mg | ORAL | Status: DC | PRN

## 2020-04-01 MED ORDER — HYDROXYZINE HCL 10 MG PO TABS
10.0000 mg | ORAL_TABLET | Freq: Three times a day (TID) | ORAL | Status: DC | PRN
  Filled 2020-04-01: qty 1

## 2020-04-01 MED ORDER — POTASSIUM CHLORIDE 10 MEQ/100ML IV SOLN
10.0000 meq | INTRAVENOUS | Status: AC
  Administered 2020-04-01 (×4): 10 meq via INTRAVENOUS
  Filled 2020-04-01 (×6): qty 100

## 2020-04-01 MED ORDER — ACETAMINOPHEN 500 MG PO TABS: 1000.0000 mg | ORAL_TABLET | Freq: Four times a day (QID) | ORAL | Status: DC

## 2020-04-01 MED ORDER — KETOROLAC TROMETHAMINE 15 MG/ML IJ SOLN
30.0000 mg | Freq: Four times a day (QID) | INTRAMUSCULAR | Status: AC
  Administered 2020-04-01 – 2020-04-05 (×14): 30 mg via INTRAVENOUS
  Filled 2020-04-01 (×18): qty 2

## 2020-04-01 MED ORDER — POTASSIUM CHLORIDE 10 MEQ/100ML IV SOLN
10.0000 meq | INTRAVENOUS | Status: AC
  Administered 2020-04-01 (×2): 10 meq via INTRAVENOUS

## 2020-04-01 MED ORDER — ACETAMINOPHEN 500 MG PO TABS
1000.0000 mg | ORAL_TABLET | Freq: Four times a day (QID) | ORAL | Status: DC
  Administered 2020-04-01 – 2020-04-12 (×38): 1000 mg via ORAL
  Filled 2020-04-01 (×43): qty 2

## 2020-04-01 MED ORDER — ENOXAPARIN SODIUM 30 MG/0.3ML ~~LOC~~ SOLN
30.0000 mg | Freq: Two times a day (BID) | SUBCUTANEOUS | Status: DC
  Administered 2020-04-01 – 2020-04-06 (×7): 30 mg via SUBCUTANEOUS
  Filled 2020-04-01 (×10): qty 0.3

## 2020-04-01 MED ORDER — WHITE PETROLATUM EX OINT
TOPICAL_OINTMENT | CUTANEOUS | Status: AC
  Filled 2020-04-01: qty 28.35

## 2020-04-01 MED ORDER — OXYCODONE HCL 5 MG PO TABS
5.0000 mg | ORAL_TABLET | ORAL | Status: DC | PRN
  Administered 2020-04-01 – 2020-04-05 (×14): 10 mg via ORAL
  Administered 2020-04-05: 5 mg via ORAL
  Administered 2020-04-06 – 2020-04-11 (×17): 10 mg via ORAL
  Filled 2020-04-01 (×33): qty 2

## 2020-04-01 MED ORDER — LORAZEPAM 2 MG/ML IJ SOLN: 1.0000 mg | Freq: Once | INTRAMUSCULAR | Status: DC
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
## 2020-04-01 NOTE — Addendum Note (Signed)
Addendum  created 04/01/20 0757 by Adair Laundry, CRNA   Order list changed

## 2020-04-01 NOTE — Progress Notes (Signed)
RT, RN and transporter; transported pt from 4N17 to CT and back with no complications. Pts respiratory status remained stable throughout transport. RT will continue to monitor.
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
## 2020-04-01 NOTE — Progress Notes (Signed)
CT c-spine reviewed and negative for fracture. Clinical exam performed to evaluate for ligamentous injury. C-spine evaluation performed. No midline pain or TTP. Patient able to turn head left and right without midline cervical pain. Neck flexion and extension performed without midline pain. C-spine cleared and collar removed.   Susan Bluitt N. Yer Castello, MD General and Trauma Surgery [facility]   
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
## 2020-04-01 NOTE — TOC CAGE-AID Note (Addendum)
Transition of Care [facility]) - CAGE-AID Screening   Patient Details  Name: Susan Graves MRN: [id number] Date of Birth: May 08, 1996  Transition of Care [facility]) CM/SW Contact:    Glennon Mac, RN Phone Number: 04/01/2020, 3:02 PM      CAGE-AID Screening: Substance Abuse Screening unable to be completed due to: : Patient Refused  Have You Ever Felt You Ought to Cut Down on Your Drinking or Drug Use?: No Have People Annoyed You By [employer] Your Drinking Or Drug Use?: No Have You Felt Bad Or Guilty About Your Drinking Or Drug Use?: No Have You Ever Had a Drink or Used Drugs First Thing In The Morning to STeady Your Nerves or to Get Rid of a Hangover?: No CAGE-AID Score: 0  Substance Abuse Education Offered: Yes   Pt admits to ETOH use, but states that she does not have a problem with ETOH.  Declines need for SA resources.     Quintella Baton, RN, BSN  Trauma/Neuro ICU Case Manager [phone]
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
## 2020-04-01 NOTE — Consult Note (Signed)
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 Systems  Psychiatric/Behavioral: The patient is nervous/anxious.   All other systems reviewed and are negative.   Blood pressure 97/64, pulse (!) 132, temperature 98.8 F (37.1 C), temperature source Axillary, resp. rate 16, height 5' 7"  (1.702 m), weight 61.2 kg, SpO2 100 %.Body mass index is 21.14 kg/m.  General Appearance: Casual  Eye Contact:  Good  Speech:  Normal Rate  Volume:  Normal  Mood:  Anxious  Affect:  Congruent  Thought Process:  Coherent and Descriptions of Associations: Intact  Orientation:  Full (Time, Place, and Person)   Thought Content:  WDL and Logical  Suicidal Thoughts:  No  Homicidal Thoughts:  No  Memory:  Immediate;   Fair Recent;   Fair  Judgement:  Fair  Insight:  Fair  Psychomotor Activity:  Decreased  Concentration:  Concentration: Good and Attention Span: Good  Recall:  Good  Fund of Knowledge:  Fair  Language:  Good  Akathisia:  No  Handed:  Right  AIMS (  if indicated):     Assets:  Housing Leisure Time Resilience Social Support  ADL's:  Impaired  Cognition:  WNL  Sleep:        Treatment Plan Summary: General anxiety disorder: -Recommend discontinuing benzodiazepines -Continue BuSpar 5 mg 3 times daily -Recommend hydroxyzine 25 mg 3 times daily as needed  Disposition: No evidence of imminent risk to self or others at present.   Patient does not meet criteria for psychiatric inpatient admission. Supportive therapy provided about ongoing stressors.  Waylan Boga, NP 04/01/2020 10:43 AM

## 2020-04-02 LAB — TYPE AND SCREEN
ABO/RH(D): O POS
Antibody Screen: NEGATIVE
Unit division: 0
Unit division: 0
Unit division: 0
Unit division: 0
Unit division: 0
Unit division: 0
Unit division: 0
Unit division: 0
Unit division: 0

## 2020-04-02 LAB — PREPARE FRESH FROZEN PLASMA
Unit division: 0
Unit division: 0
Unit division: 0
Unit division: 0
Unit division: 0
Unit division: 0
Unit division: 0

## 2020-04-02 LAB — BPAM RBC
Blood Product Expiration Date: 202106272359
Blood Product Expiration Date: 202106272359
Blood Product Expiration Date: 202107122359
Blood Product Expiration Date: 202107122359
Blood Product Expiration Date: 202107122359
Blood Product Expiration Date: 202107122359
Blood Product Expiration Date: 202107152359
Blood Product Expiration Date: 202107172359
Blood Product Expiration Date: 202107172359
ISSUE DATE / TIME: 202106170549
ISSUE DATE / TIME: 202106170603
ISSUE DATE / TIME: 202106170603
ISSUE DATE / TIME: 202106170613
ISSUE DATE / TIME: 202106170613
ISSUE DATE / TIME: 202106171623
ISSUE DATE / TIME: 202106171623
ISSUE DATE / TIME: 202106180526
ISSUE DATE / TIME: 202106180855
Unit Type and Rh: 5100
Unit Type and Rh: 5100
Unit Type and Rh: 5100
Unit Type and Rh: 5100
Unit Type and Rh: 9500
Unit Type and Rh: 9500
Unit Type and Rh: 9500
Unit Type and Rh: 9500
Unit Type and Rh: 9500

## 2020-04-02 LAB — BPAM FFP
Blood Product Expiration Date: 202106232359
Blood Product Expiration Date: 202106282359
Blood Product Expiration Date: 202107012359
Blood Product Expiration Date: 202107022359
Blood Product Expiration Date: 202107022359
Blood Product Expiration Date: 202107032359
Blood Product Expiration Date: 202107032359
ISSUE DATE / TIME: 202106170603
ISSUE DATE / TIME: 202106170603
ISSUE DATE / TIME: 202106190158
ISSUE DATE / TIME: 202106190158
ISSUE DATE / TIME: 202106190204
ISSUE DATE / TIME: 202106190204
ISSUE DATE / TIME: 202106190256
Unit Type and Rh: 600
Unit Type and Rh: 6200
Unit Type and Rh: 6200
Unit Type and Rh: 6200
Unit Type and Rh: 6200
Unit Type and Rh: 6200
Unit Type and Rh: 6200

## 2020-04-02 LAB — BASIC METABOLIC PANEL
Anion gap: 5 (ref 5–15)
BUN: <5 mg/dL — ABNORMAL LOW (ref 6–20)
CO2: 24 mmol/L (ref 22–32)
Calcium: 7.8 mg/dL — ABNORMAL LOW (ref 8.9–10.3)
Chloride: 110 mmol/L (ref 98–111)
Creatinine, Ser: 0.62 mg/dL (ref 0.44–1.00)
GFR calc Af Amer: >60 mL/min (ref >60)
GFR calc non Af Amer: >60 mL/min (ref >60)
Glucose, Bld: 103 mg/dL — ABNORMAL HIGH (ref 70–99)
Potassium: 3.6 mmol/L (ref 3.5–5.1)
Sodium: 139 mmol/L (ref 135–145)

## 2020-04-02 LAB — CBC
HCT: 24.7 % — ABNORMAL LOW (ref 36.0–46.0)
Hemoglobin: 7.8 g/dL — ABNORMAL LOW (ref 12.0–15.0)
MCH: 25.7 pg — ABNORMAL LOW (ref 26.0–34.0)
MCHC: 31.6 g/dL (ref 30.0–36.0)
MCV: 81.5 fL (ref 80.0–100.0)
Platelets: 97 10*3/uL — ABNORMAL LOW (ref 150–400)
RBC: 3.03 MIL/uL — ABNORMAL LOW (ref 3.87–5.11)
RDW: 17.4 % — ABNORMAL HIGH (ref 11.5–15.5)
WBC: 8.2 10*3/uL (ref 4.0–10.5)
nRBC: 0 % (ref 0.0–0.2)

## 2020-04-02 MED ORDER — VITAMIN D 25 MCG (1000 UNIT) PO TABS
2000.0000 [IU] | ORAL_TABLET | Freq: Every day | ORAL | Status: DC
  Administered 2020-04-02 – 2020-04-12 (×11): 2000 [IU] via ORAL
  Filled 2020-04-02 (×11): qty 2
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
## 2020-04-02 NOTE — Progress Notes (Signed)
Report given to Rayfield Citizen, RN on 4NP at this time.  Pt has been recently medicated for pain.  VSS.  No s/s of any acute distress noted.

## 2020-04-02 NOTE — Progress Notes (Signed)
Pt arrived to 4NP03 from 4NICU. Pt's vital signs WNL. Assessment completed, however, pt refused for RN to check wounds under foam dressings. No concerns or requests at this time. Call bell and phone left within reach of pt.   Robina Ade, RN
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
## 2020-04-03 LAB — CBC
HCT: 24.7 % — ABNORMAL LOW (ref 36.0–46.0)
Hemoglobin: 7.9 g/dL — ABNORMAL LOW (ref 12.0–15.0)
MCH: 26.2 pg (ref 26.0–34.0)
MCHC: 32 g/dL (ref 30.0–36.0)
MCV: 81.8 fL (ref 80.0–100.0)
Platelets: 106 10*3/uL — ABNORMAL LOW (ref 150–400)
RBC: 3.02 MIL/uL — ABNORMAL LOW (ref 3.87–5.11)
RDW: 17.3 % — ABNORMAL HIGH (ref 11.5–15.5)
WBC: 7.7 10*3/uL (ref 4.0–10.5)
nRBC: 0 % (ref 0.0–0.2)

## 2020-04-03 LAB — BASIC METABOLIC PANEL
Anion gap: 6 (ref 5–15)
BUN: <5 mg/dL — ABNORMAL LOW (ref 6–20)
CO2: 22 mmol/L (ref 22–32)
Calcium: 7.9 mg/dL — ABNORMAL LOW (ref 8.9–10.3)
Chloride: 110 mmol/L (ref 98–111)
Creatinine, Ser: 0.55 mg/dL (ref 0.44–1.00)
GFR calc Af Amer: >60 mL/min (ref >60)
GFR calc non Af Amer: >60 mL/min (ref >60)
Glucose, Bld: 90 mg/dL (ref 70–99)
Potassium: 3.4 mmol/L — ABNORMAL LOW (ref 3.5–5.1)
Sodium: 138 mmol/L (ref 135–145)
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
## 2020-04-04 LAB — BASIC METABOLIC PANEL
Anion gap: 10 (ref 5–15)
BUN: <5 mg/dL — ABNORMAL LOW (ref 6–20)
CO2: 20 mmol/L — ABNORMAL LOW (ref 22–32)
Calcium: 8.4 mg/dL — ABNORMAL LOW (ref 8.9–10.3)
Chloride: 110 mmol/L (ref 98–111)
Creatinine, Ser: 0.56 mg/dL (ref 0.44–1.00)
GFR calc Af Amer: >60 mL/min (ref >60)
GFR calc non Af Amer: >60 mL/min (ref >60)
Glucose, Bld: 85 mg/dL (ref 70–99)
Potassium: 3.6 mmol/L (ref 3.5–5.1)
Sodium: 140 mmol/L (ref 135–145)

## 2020-04-04 LAB — CBC
HCT: 26.9 % — ABNORMAL LOW (ref 36.0–46.0)
Hemoglobin: 8.7 g/dL — ABNORMAL LOW (ref 12.0–15.0)
MCH: 26.2 pg (ref 26.0–34.0)
MCHC: 32.3 g/dL (ref 30.0–36.0)
MCV: 81 fL (ref 80.0–100.0)
Platelets: 164 10*3/uL (ref 150–400)
RBC: 3.32 MIL/uL — ABNORMAL LOW (ref 3.87–5.11)
RDW: 16.8 % — ABNORMAL HIGH (ref 11.5–15.5)
WBC: 6.6 10*3/uL (ref 4.0–10.5)
nRBC: 0 % (ref 0.0–0.2)

## 2020-04-04 MED ORDER — BACITRACIN ZINC 500 UNIT/GM EX OINT
TOPICAL_OINTMENT | Freq: Two times a day (BID) | CUTANEOUS | Status: DC
  Administered 2020-04-05 – 2020-04-09 (×2): 1 via TOPICAL
  Filled 2020-04-04: qty 28.4

## 2020-04-04 NOTE — Progress Notes (Signed)
Inpatient Rehab Admissions Coordinator:   Discussed in trauma rounds.  Note therapy recommendations for CIR. Will place an order for CIR per our protocol.   Estill Dooms, PT, DPT Admissions Coordinator [phone] 04/04/20  3:18 PM
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
## 2020-04-05 NOTE — Progress Notes (Signed)
   04/05/20 2025  Assess: MEWS Score  Temp (!) 102.3 F (39.1 C)  BP (!) 110/59  Pulse Rate (!) 109  ECG Heart Rate (!) 108  Resp 20  SpO2 99 %  Assess: MEWS Score  MEWS Temp 2  MEWS Systolic 0  MEWS Pulse 1  MEWS RR 0  MEWS LOC 0  MEWS Score 3  MEWS Score Color Yellow  Assess: if the MEWS score is Yellow or Red  Were vital signs taken at a resting state? Yes  Focused Assessment Documented focused assessment  Early Detection of Sepsis Score *See Row Information* Medium  MEWS guidelines implemented *See Row Information* No, previously yellow, continue vital signs every 4 hours  Treat  MEWS Interventions Administered prn meds/treatments  Take Vital Signs  Increase Vital Sign Frequency  Yellow: Q 2hr X 2 then Q 4hr X 2, if remains yellow, continue Q 4hrs  Escalate  MEWS: Escalate Yellow: discuss with charge nurse/RN and consider discussing with provider and RRT  Notify: Charge Nurse/RN  Name of Charge Nurse/RN Notified Katey  Date Charge Nurse/RN Notified 04/05/20  Time Charge Nurse/RN Notified 2030

## 2020-04-05 NOTE — Progress Notes (Signed)
Inpatient Rehab Admissions:  Inpatient Rehab Consult received.  I met with patient at the bedside for rehabilitation assessment and to discuss goals and expectations of an inpatient rehab admission.  Pt is open to CIR at discharge.  States she would plan to stay with a friend of her ex-girlfriend.  I did let her know that I would need to be able to confirm this dispo in order to offer a bed, and she gave me permission to speak with her ex-girlfriend.  Pt also notes that she does have health insurance through her mother.  She gave me permission to speak with her, which I will do tomorrow.  Depending on pt's insurance coverage, may need prior authorization.   Signed: Shann Medal, PT, DPT Admissions Coordinator [phone] 04/05/20  5:06 PM
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
## 2020-04-06 ENCOUNTER — Inpatient Hospital Stay (HOSPITAL_COMMUNITY): Payer: Self-pay

## 2020-04-06 DIAGNOSIS — R509 Fever, unspecified: Secondary | ICD-10-CM

## 2020-04-06 DIAGNOSIS — R000 Tachycardia, unspecified: Secondary | ICD-10-CM

## 2020-04-06 DIAGNOSIS — I82412 Acute embolism and thrombosis of left femoral vein: Secondary | ICD-10-CM

## 2020-04-06 DIAGNOSIS — I82432 Acute embolism and thrombosis of left popliteal vein: Secondary | ICD-10-CM

## 2020-04-06 LAB — CBC
HCT: 29.4 % — ABNORMAL LOW (ref 36.0–46.0)
Hemoglobin: 9.3 g/dL — ABNORMAL LOW (ref 12.0–15.0)
MCH: 25.9 pg — ABNORMAL LOW (ref 26.0–34.0)
MCHC: 31.6 g/dL (ref 30.0–36.0)
MCV: 81.9 fL (ref 80.0–100.0)
Platelets: 324 10*3/uL (ref 150–400)
RBC: 3.59 MIL/uL — ABNORMAL LOW (ref 3.87–5.11)
RDW: 17.2 % — ABNORMAL HIGH (ref 11.5–15.5)
WBC: 11.3 10*3/uL — ABNORMAL HIGH (ref 4.0–10.5)
nRBC: 0 % (ref 0.0–0.2)

## 2020-04-06 LAB — HEPARIN LEVEL (UNFRACTIONATED): Heparin Unfractionated: 0.22 IU/mL — ABNORMAL LOW (ref 0.30–0.70)

## 2020-04-06 MED ORDER — HEPARIN (PORCINE) 25000 UT/250ML-% IV SOLN
1250.0000 [IU]/h | INTRAVENOUS | Status: DC
  Administered 2020-04-06: 1000 [IU]/h via INTRAVENOUS
  Filled 2020-04-06 (×2): qty 250

## 2020-04-06 MED ORDER — KETOROLAC TROMETHAMINE 15 MG/ML IJ SOLN
30.0000 mg | Freq: Four times a day (QID) | INTRAMUSCULAR | Status: DC
  Filled 2020-04-06: qty 2

## 2020-04-06 MED ORDER — MORPHINE SULFATE (PF) 2 MG/ML IV SOLN
2.0000 mg | Freq: Three times a day (TID) | INTRAVENOUS | Status: DC | PRN
  Administered 2020-04-06 – 2020-04-09 (×4): 2 mg via INTRAVENOUS
  Filled 2020-04-06 (×4): qty 1

## 2020-04-06 MED ORDER — HEPARIN BOLUS VIA INFUSION
2450.0000 [IU] | Freq: Once | INTRAVENOUS | Status: AC
  Administered 2020-04-06: 2450 [IU] via INTRAVENOUS
  Filled 2020-04-06: qty 2450

## 2020-04-06 MED ORDER — GABAPENTIN 100 MG PO CAPS
100.0000 mg | ORAL_CAPSULE | Freq: Three times a day (TID) | ORAL | Status: DC
  Administered 2020-04-06 – 2020-04-07 (×5): 100 mg via ORAL
  Filled 2020-04-06 (×5): qty 1

## 2020-04-06 MED ORDER — POLYETHYLENE GLYCOL 3350 17 G PO PACK
17.0000 g | PACK | Freq: Every day | ORAL | Status: DC
  Administered 2020-04-07 – 2020-04-11 (×3): 17 g via ORAL
  Filled 2020-04-06 (×7): qty 1

## 2020-04-06 MED ORDER — DOCUSATE SODIUM 100 MG PO CAPS
100.0000 mg | ORAL_CAPSULE | Freq: Two times a day (BID) | ORAL | Status: DC
  Administered 2020-04-07 – 2020-04-12 (×10): 100 mg via ORAL
  Filled 2020-04-06 (×12): qty 1
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
## 2020-04-06 NOTE — Progress Notes (Signed)
Lower extremity venous bilateral study completed.   Preliminary results relayed to Barbie, RN.   See Cv Proc for preliminary results.   Jean Rosenthal
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
## 2020-04-06 NOTE — Progress Notes (Signed)
PT Cancellation Note  Patient Details Name: Susan Graves MRN: [id number] DOB: 1996/09/21   Cancelled Treatment:    Reason Eval/Treat Not Completed: Medical issues which prohibited therapy. Pt with new found LLE DVT. PT will hold mobility until pt is adequately anticoagulated.   Arlyss Gandy 04/06/2020, 4:06 PM
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
## 2020-04-06 NOTE — Progress Notes (Signed)
Inpatient Rehab Admissions Coordinator:   Spoke with pt's mother regarding insurance coverage.  Mother states she has COBRA coverage and is not sure whether Eastyn is on that policy.  She is also hoping to convince pt to come home with her short term at d/c so she will be able to support her.  I can look into pt's insurance today.  Note that she's still requiring IV pain control and pain will need to be controlled on PO for consideration for potential CIR admit.  I will also reach out to [employer] for Medicaid screening.  I will f/u with pt and her mother on Friday.   Estill Dooms, PT, DPT Admissions Coordinator [phone] 04/06/20  11:15 AM
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
## 2020-04-07 ENCOUNTER — Inpatient Hospital Stay (HOSPITAL_COMMUNITY): Payer: Self-pay

## 2020-04-07 LAB — URINALYSIS, ROUTINE W REFLEX MICROSCOPIC
Bilirubin Urine: NEGATIVE
Glucose, UA: NEGATIVE mg/dL
Ketones, ur: NEGATIVE mg/dL
Nitrite: NEGATIVE
Protein, ur: NEGATIVE mg/dL
Specific Gravity, Urine: 1.015 (ref 1.005–1.030)
pH: 7 (ref 5.0–8.0)

## 2020-04-07 LAB — CBC
HCT: 27.9 % — ABNORMAL LOW (ref 36.0–46.0)
Hemoglobin: 8.8 g/dL — ABNORMAL LOW (ref 12.0–15.0)
MCH: 26.2 pg (ref 26.0–34.0)
MCHC: 31.5 g/dL (ref 30.0–36.0)
MCV: 83 fL (ref 80.0–100.0)
Platelets: 366 10*3/uL (ref 150–400)
RBC: 3.36 MIL/uL — ABNORMAL LOW (ref 3.87–5.11)
RDW: 17.6 % — ABNORMAL HIGH (ref 11.5–15.5)
WBC: 10.4 10*3/uL (ref 4.0–10.5)
nRBC: 0 % (ref 0.0–0.2)

## 2020-04-07 LAB — HEPARIN LEVEL (UNFRACTIONATED)
Heparin Unfractionated: 0.19 [IU]/mL — ABNORMAL LOW (ref 0.30–0.70)
Heparin Unfractionated: 0.25 [IU]/mL — ABNORMAL LOW (ref 0.30–0.70)

## 2020-04-07 MED ORDER — HEPARIN BOLUS VIA INFUSION
1000.0000 [IU] | Freq: Once | INTRAVENOUS | Status: AC
  Administered 2020-04-07: 1000 [IU] via INTRAVENOUS
  Filled 2020-04-07: qty 1000

## 2020-04-07 MED ORDER — IOHEXOL 350 MG/ML SOLN
100.0000 mL | Freq: Once | INTRAVENOUS | Status: AC | PRN
  Administered 2020-04-07: 100 mL via INTRAVENOUS

## 2020-04-07 MED ORDER — APIXABAN 5 MG PO TABS: 5.0000 mg | ORAL_TABLET | Freq: Two times a day (BID) | ORAL | Status: DC

## 2020-04-07 MED ORDER — APIXABAN 5 MG PO TABS
10.0000 mg | ORAL_TABLET | Freq: Two times a day (BID) | ORAL | Status: DC
  Administered 2020-04-07 – 2020-04-12 (×11): 10 mg via ORAL
  Filled 2020-04-07 (×11): qty 2

## 2020-04-07 NOTE — Discharge Instructions (Signed)
Information on my medicine - ELIQUIS (apixaban)  This medication education was reviewed with me or my healthcare representative as part of my discharge preparation.  Why was Eliquis prescribed for you? Eliquis was prescribed to treat blood clots that may have been found in the veins of your legs (deep vein thrombosis) or in your lungs (pulmonary embolism) and to reduce the risk of them occurring again.  What do You need to know about Eliquis ? The starting dose is 10 mg (two 5 mg tablets) taken TWICE daily for the FIRST SEVEN (7) DAYS, then on (enter date)  04/14/2020  the dose is reduced to ONE 5 mg tablet taken TWICE daily.  Eliquis may be taken with or without food.   Try to take the dose about the same time in the morning and in the evening. If you have difficulty swallowing the tablet whole please discuss with your pharmacist how to take the medication safely.  Take Eliquis exactly as prescribed and DO NOT stop taking Eliquis without talking to the doctor who prescribed the medication.  Stopping may increase your risk of developing a new blood clot.  Refill your prescription before you run out.  After discharge, you should have regular check-up appointments with your healthcare provider that is prescribing your Eliquis.    What do you do if you miss a dose? If a dose of ELIQUIS is not taken at the scheduled time, take it as soon as possible on the same day and twice-daily administration should be resumed. The dose should not be doubled to make up for a missed dose.  Important Safety Information A possible side effect of Eliquis is bleeding. You should call your healthcare provider right away if you experience any of the following: ? Bleeding from an injury or your nose that does not stop. ? Unusual colored urine (red or dark brown) or unusual colored stools (red or black). ? Unusual bruising for unknown reasons. ? A serious fall or if you hit your head (even if there is no  bleeding).  Some medicines may interact with Eliquis and might increase your risk of bleeding or clotting while on Eliquis. To help avoid this, consult your healthcare provider or pharmacist prior to using any new prescription or non-prescription medications, including herbals, vitamins, non-steroidal anti-inflammatory drugs (NSAIDs) and supplements.  This website has more information on Eliquis (apixaban): http://www.eliquis.com/eliquis/home

## 2020-04-07 NOTE — Progress Notes (Addendum)
Pt given first dose of Eliquis. Heparin drip discontinued right after.  Robina Ade, RN
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
## 2020-04-07 NOTE — Progress Notes (Signed)
ANTICOAGULATION CONSULT NOTE - Follow Up Consult  Pharmacy Consult for heparin Indication: DVT  Labs: Recent Labs    04/04/20 0607 04/06/20 0953 04/06/20 1611 04/06/20 2351  HGB 8.7* 9.3*  --   --   HCT 26.9* 29.4*  --   --   PLT 164 324  --   --   HEPARINUNFRC  --   --  0.22* 0.25*  CREATININE 0.56  --   --   --     Assessment: 23yo female subtherapeutic on heparin with initial dosing for new DVT; no gtt issues or signs of bleeding per RN.  Goal of Therapy:  Heparin level 0.3-0.7 units/ml   Plan:  Will give small bolus of 1000 units and increase heparin gtt by 1-2 units/kg/hr to 1100 units/hr and check level in 6 hours.    Vernard Gambles, PharmD, BCPS  04/07/2020,12:34 AM
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
## 2020-04-07 NOTE — Progress Notes (Signed)
Patient is refusing to keep ortho boot on RLE. She says that "she hasn't been wearing it at night and had it off earlier today too." Patient's mother called and stated that "Yarithza wants the boot off and she has never had to wear it at night." Patient educated. Will continue to monitor.
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
## 2020-04-07 NOTE — Progress Notes (Signed)
8 staples removed from pt's left head lac.  Robina Ade, RN

## 2020-04-07 NOTE — Progress Notes (Addendum)
Paged to patients bedside due to new chest pain.  Patient reports acute onset right-sided chest pain that started almost an hour ago, worse with inspiration. Denies associated SOB. Denies hemoptysis. Denies lightheadedness but has not been out of bed/exerted herself with PT today  HR during my exam 122 (from 80-100 bpm this AM)  O2 Sats 94% ORA  BP 107/59 TMAX 100.7   Patient is alert, NAD Non-labored breathing, lungs CTAB  Moving all extremities spontaneously Left pedal pulse 2+    Given acute onset pleuritic chest pain, worsening tachycardia, and known DVT, I am going to order CTA Chest to r/o pulmonary embolus.   Hosie Spangle, PA-C
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
## 2020-04-07 NOTE — Progress Notes (Signed)
Pt left unit to go to CT  Robina Ade, RN

## 2020-04-07 NOTE — Progress Notes (Signed)
Pt's mother requesting update from MD when CT results are available.  Robina Ade, RN

## 2020-04-07 NOTE — Progress Notes (Signed)
Inpatient Rehab Admissions Coordinator:   Met with pt at bedside to discuss lack of insurance and cost of rehab.  Also discussed process for getting medicaid screening (will contact [employer] again, as they had not contacted patient yet following my voicemail yesterday).  Pt is agreeable to discharge to her grandmother's house where she will have 24/7 support from her grandmother, uncle, and other family members PRN.  During our conversation pt sustaining HR between 115-126.  Sudden onset of chest pain, to the R of the sternum, worse with inspiration.  Pt denies feeling SOB but respiratory rate noted to increase slightly.  Notified RN.  Will continue to follow for timing of possible rehab admission pending medical stability and bed availability.   Shann Medal, PT, DPT Admissions Coordinator [phone] 04/07/20  9:43 PM

## 2020-04-07 NOTE — Progress Notes (Signed)
Patient ID: Susan Graves, female   DOB: Mar 24, 1996, 24 y.o.   MRN: [id number]  Progress Note    04/07/2020 7:33 AM 7 Days Post-Op  Subjective: No change in physical exam.  Sleepy this morning.   Vitals:   04/07/20 0257 04/07/20 0451  BP: 107/68 107/74  Pulse:  (!) 101  Resp: (!) 25 17  Temp: 98.9 F (37.2 C) 98.1 F (36.7 C)  SpO2: 99% 100%   Physical Exam: Palpable dorsalis pedis pulse on the left.  Mild left leg swelling  CBC    Component Value Date/Time   WBC 11.3 (H) 04/06/2020 0953   RBC 3.59 (L) 04/06/2020 0953   HGB 9.3 (L) 04/06/2020 0953   HCT 29.4 (L) 04/06/2020 0953   PLT 324 04/06/2020 0953   MCV 81.9 04/06/2020 0953   MCH 25.9 (L) 04/06/2020 0953   MCHC 31.6 04/06/2020 0953   RDW 17.2 (H) 04/06/2020 0953    BMET    Component Value Date/Time   NA 140 04/04/2020 0607   K 3.6 04/04/2020 0607   CL 110 04/04/2020 0607   CO2 20 (L) 04/04/2020 0607   GLUCOSE 85 04/04/2020 0607   BUN <5 (L) 04/04/2020 0607   CREATININE 0.56 04/04/2020 0607   CALCIUM 8.4 (L) 04/04/2020 0607   GFRNONAA >60 04/04/2020 0607   GFRAA >60 04/04/2020 0607    INR    Component Value Date/Time   INR 1.3 (H) 03/31/2020 1202     Intake/Output Summary (Last 24 hours) at 04/07/2020 0733 Last data filed at 04/06/2020 2026 Gross per 24 hour  Intake 364.5 ml  Output 1900 ml  Net -1535.5 ml     Assessment/Plan:  24 y.o. female nonocclusive extensive DVT in left leg related to pelvic fracture.  Recommend 6 months of anticoagulation and then discontinuation.  Will not follow actively.  Please call if we can assist.     Larina Earthly, MD [facility] [phone] 04/07/2020 7:33 AM

## 2020-04-08 MED ORDER — KETOROLAC TROMETHAMINE 10 MG PO TABS
10.0000 mg | ORAL_TABLET | Freq: Four times a day (QID) | ORAL | Status: DC
  Administered 2020-04-08 – 2020-04-10 (×7): 10 mg via ORAL
  Filled 2020-04-08 (×9): qty 1

## 2020-04-08 MED ORDER — GABAPENTIN 100 MG PO CAPS
200.0000 mg | ORAL_CAPSULE | Freq: Three times a day (TID) | ORAL | Status: DC
  Administered 2020-04-08 – 2020-04-12 (×14): 200 mg via ORAL
  Filled 2020-04-08 (×15): qty 2
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
## 2020-04-08 NOTE — TOC Progression Note (Signed)
Transition of Care [facility]) - Progression Note    Patient Details  Name: Susan Graves MRN: [id number] Date of Birth: 11/29/1995  Transition of Care [facility]) CM/SW Contact  Glennon Mac, RN Phone Number: 04/08/2020, 4:39 PM  Clinical Narrative:  CIR cont to follow for possible admission; pt confirms that she will dc to her grandmother's home at discharge, which is all one level and accessible.  Attempted to speak with Jonna Clark, patient's mother, by phone, but she did not answer, and does not have her voicemail set up.    Asked to check coverage for Eliquis; pt has no insurance and will receive free 30 day trial card for first month of therapy.  Noted pt to be on Eliquis for 6 months; will provide Eliquis assistance program information to pt/mother.  Would recommend follow up at [facility] for PCP f/u with pharmacy assistance.       Expected Discharge Plan: IP Rehab Facility Barriers to Discharge: Continued Medical Work up  Expected Discharge Plan and Services Expected Discharge Plan: IP Rehab Facility   Discharge Planning Services: Medication Assistance                                           Social Determinants of Health (SDOH) Interventions    Readmission Risk Interventions No flowsheet data found.  Quintella Baton, RN, BSN  Trauma/Neuro ICU Case Manager [phone]
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
## 2020-04-08 NOTE — Progress Notes (Signed)
Patient ID: Susan Graves, female   DOB: 01-23-1996, 24 y.o.   MRN: [id number]  Pt only needs to wear AFO on RLE when she is weightbearing.    Freeman Caldron, PA-C Orthopedic Surgery [phone]
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
## 2020-04-10 ENCOUNTER — Encounter (HOSPITAL_COMMUNITY): Payer: Self-pay

## 2020-04-10 ENCOUNTER — Other Ambulatory Visit: Payer: Self-pay

## 2020-04-10 MED ORDER — IBUPROFEN 200 MG PO TABS: 400.0000 mg | ORAL_TABLET | Freq: Four times a day (QID) | ORAL | Status: DC | PRN
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
## 2020-04-11 LAB — BASIC METABOLIC PANEL
Anion gap: 11 (ref 5–15)
BUN: 8 mg/dL (ref 6–20)
CO2: 27 mmol/L (ref 22–32)
Calcium: 9.8 mg/dL (ref 8.9–10.3)
Chloride: 102 mmol/L (ref 98–111)
Creatinine, Ser: 0.52 mg/dL (ref 0.44–1.00)
GFR calc Af Amer: >60 mL/min (ref >60)
GFR calc non Af Amer: >60 mL/min (ref >60)
Glucose, Bld: 122 mg/dL — ABNORMAL HIGH (ref 70–99)
Potassium: 4.4 mmol/L (ref 3.5–5.1)
Sodium: 140 mmol/L (ref 135–145)

## 2020-04-11 MED ORDER — OXYCODONE HCL 5 MG PO TABS
10.0000 mg | ORAL_TABLET | ORAL | Status: DC | PRN
  Administered 2020-04-11 (×2): 15 mg via ORAL
  Administered 2020-04-11: 10 mg via ORAL
  Administered 2020-04-12 (×3): 15 mg via ORAL
  Filled 2020-04-11 (×3): qty 3
  Filled 2020-04-11: qty 2
  Filled 2020-04-11 (×2): qty 3

## 2020-04-11 MED ORDER — KETOROLAC TROMETHAMINE 10 MG PO TABS
10.0000 mg | ORAL_TABLET | Freq: Four times a day (QID) | ORAL | Status: DC
  Administered 2020-04-11 – 2020-04-12 (×3): 10 mg via ORAL
  Filled 2020-04-11 (×6): qty 1
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
## 2020-04-11 NOTE — Progress Notes (Signed)
Inpatient Rehab Admissions Coordinator:   I have no beds available for this patient to admit to CIR today.  Will continue to follow for timing of potential admission pending bed availability.   Estill Dooms, PT, DPT Admissions Coordinator [phone] 04/11/20  4:37 PM
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
## 2020-04-12 ENCOUNTER — Inpatient Hospital Stay (HOSPITAL_COMMUNITY)
Admission: RE | Admit: 2020-04-12 | Discharge: 2020-04-20 | DRG: 560 | Disposition: A | Discharge status: Home / Self Care | Payer: Self-pay | Source: Intra-hospital | Attending: Physical Medicine & Rehabilitation | Admitting: Physical Medicine & Rehabilitation

## 2020-04-12 ENCOUNTER — Other Ambulatory Visit: Payer: Self-pay

## 2020-04-12 ENCOUNTER — Encounter (HOSPITAL_COMMUNITY): Payer: Self-pay

## 2020-04-12 ENCOUNTER — Encounter (HOSPITAL_COMMUNITY): Payer: Self-pay | Admitting: Physical Medicine & Rehabilitation

## 2020-04-12 DIAGNOSIS — S82201S Unspecified fracture of shaft of right tibia, sequela: Secondary | ICD-10-CM

## 2020-04-12 DIAGNOSIS — S332XXD Dislocation of sacroiliac and sacrococcygeal joint, subsequent encounter: Secondary | ICD-10-CM

## 2020-04-12 DIAGNOSIS — S36892D Contusion of other intra-abdominal organs, subsequent encounter: Secondary | ICD-10-CM

## 2020-04-12 DIAGNOSIS — F17210 Nicotine dependence, cigarettes, uncomplicated: Secondary | ICD-10-CM | POA: Diagnosis present

## 2020-04-12 DIAGNOSIS — I82442 Acute embolism and thrombosis of left tibial vein: Secondary | ICD-10-CM | POA: Diagnosis present

## 2020-04-12 DIAGNOSIS — S51012D Laceration without foreign body of left elbow, subsequent encounter: Secondary | ICD-10-CM

## 2020-04-12 DIAGNOSIS — K5900 Constipation, unspecified: Secondary | ICD-10-CM | POA: Diagnosis present

## 2020-04-12 DIAGNOSIS — F411 Generalized anxiety disorder: Secondary | ICD-10-CM | POA: Diagnosis present

## 2020-04-12 DIAGNOSIS — S32810S Multiple fractures of pelvis with stable disruption of pelvic ring, sequela: Secondary | ICD-10-CM

## 2020-04-12 DIAGNOSIS — S32039D Unspecified fracture of third lumbar vertebra, subsequent encounter for fracture with routine healing: Secondary | ICD-10-CM

## 2020-04-12 DIAGNOSIS — Z8614 Personal history of Methicillin resistant Staphylococcus aureus infection: Secondary | ICD-10-CM

## 2020-04-12 DIAGNOSIS — S3282XD Multiple fractures of pelvis without disruption of pelvic ring, subsequent encounter for fracture with routine healing: Secondary | ICD-10-CM

## 2020-04-12 DIAGNOSIS — S0101XD Laceration without foreign body of scalp, subsequent encounter: Secondary | ICD-10-CM

## 2020-04-12 DIAGNOSIS — M25512 Pain in left shoulder: Secondary | ICD-10-CM

## 2020-04-12 DIAGNOSIS — R7401 Elevation of levels of liver transaminase levels: Secondary | ICD-10-CM | POA: Diagnosis not present

## 2020-04-12 DIAGNOSIS — Z5320 Procedure and treatment not carried out because of patient's decision for unspecified reasons: Secondary | ICD-10-CM | POA: Diagnosis not present

## 2020-04-12 DIAGNOSIS — S329XXA Fracture of unspecified parts of lumbosacral spine and pelvis, initial encounter for closed fracture: Secondary | ICD-10-CM

## 2020-04-12 DIAGNOSIS — Z9119 Patient's noncompliance with other medical treatment and regimen: Secondary | ICD-10-CM

## 2020-04-12 DIAGNOSIS — S82141A Displaced bicondylar fracture of right tibia, initial encounter for closed fracture: Secondary | ICD-10-CM | POA: Diagnosis present

## 2020-04-12 DIAGNOSIS — R300 Dysuria: Secondary | ICD-10-CM | POA: Diagnosis not present

## 2020-04-12 DIAGNOSIS — S32059D Unspecified fracture of fifth lumbar vertebra, subsequent encounter for fracture with routine healing: Secondary | ICD-10-CM

## 2020-04-12 DIAGNOSIS — J939 Pneumothorax, unspecified: Secondary | ICD-10-CM | POA: Diagnosis present

## 2020-04-12 DIAGNOSIS — S3282XA Multiple fractures of pelvis without disruption of pelvic ring, initial encounter for closed fracture: Secondary | ICD-10-CM | POA: Diagnosis present

## 2020-04-12 DIAGNOSIS — S82141D Displaced bicondylar fracture of right tibia, subsequent encounter for closed fracture with routine healing: Principal | ICD-10-CM

## 2020-04-12 DIAGNOSIS — D62 Acute posthemorrhagic anemia: Secondary | ICD-10-CM | POA: Diagnosis present

## 2020-04-12 MED ORDER — KETOROLAC TROMETHAMINE 10 MG PO TABS
10.0000 mg | ORAL_TABLET | Freq: Four times a day (QID) | ORAL | Status: AC
  Administered 2020-04-13 – 2020-04-15 (×11): 10 mg via ORAL
  Filled 2020-04-12 (×11): qty 1

## 2020-04-12 MED ORDER — VITAMIN D 25 MCG (1000 UNIT) PO TABS
2000.0000 [IU] | ORAL_TABLET | Freq: Every day | ORAL | Status: DC
  Administered 2020-04-13 – 2020-04-20 (×8): 2000 [IU] via ORAL
  Filled 2020-04-12 (×8): qty 2

## 2020-04-12 MED ORDER — DOCUSATE SODIUM 100 MG PO CAPS
100.0000 mg | ORAL_CAPSULE | Freq: Two times a day (BID) | ORAL | Status: DC
  Administered 2020-04-12 – 2020-04-20 (×10): 100 mg via ORAL
  Filled 2020-04-12 (×14): qty 1

## 2020-04-12 MED ORDER — GABAPENTIN 100 MG PO CAPS
200.0000 mg | ORAL_CAPSULE | Freq: Three times a day (TID) | ORAL | Status: DC
  Administered 2020-04-12 – 2020-04-20 (×23): 200 mg via ORAL
  Filled 2020-04-12 (×23): qty 2

## 2020-04-12 MED ORDER — POLYETHYLENE GLYCOL 3350 17 G PO PACK
17.0000 g | PACK | Freq: Every day | ORAL | Status: DC
  Administered 2020-04-13 – 2020-04-18 (×5): 17 g via ORAL
  Filled 2020-04-12 (×8): qty 1

## 2020-04-12 MED ORDER — SORBITOL 70 % SOLN: 30.0000 mL | Freq: Every day | Status: DC | PRN

## 2020-04-12 MED ORDER — HYDROXYZINE HCL 10 MG PO TABS
10.0000 mg | ORAL_TABLET | Freq: Three times a day (TID) | ORAL | Status: DC | PRN
  Administered 2020-04-18 – 2020-04-19 (×3): 10 mg via ORAL
  Filled 2020-04-12 (×5): qty 1

## 2020-04-12 MED ORDER — METHOCARBAMOL 500 MG PO TABS
1000.0000 mg | ORAL_TABLET | Freq: Three times a day (TID) | ORAL | Status: DC
  Administered 2020-04-14 – 2020-04-16 (×7): 1000 mg via ORAL
  Filled 2020-04-12 (×14): qty 2

## 2020-04-12 MED ORDER — BUSPIRONE HCL 5 MG PO TABS
5.0000 mg | ORAL_TABLET | Freq: Three times a day (TID) | ORAL | Status: DC
  Administered 2020-04-13: 5 mg via ORAL
  Filled 2020-04-12 (×5): qty 1

## 2020-04-12 MED ORDER — APIXABAN 5 MG PO TABS
5.0000 mg | ORAL_TABLET | Freq: Two times a day (BID) | ORAL | Status: DC
  Administered 2020-04-14 – 2020-04-20 (×13): 5 mg via ORAL
  Filled 2020-04-12 (×13): qty 1

## 2020-04-12 MED ORDER — APIXABAN 5 MG PO TABS
10.0000 mg | ORAL_TABLET | Freq: Two times a day (BID) | ORAL | Status: AC
  Administered 2020-04-12 – 2020-04-13 (×3): 10 mg via ORAL
  Filled 2020-04-12 (×3): qty 2

## 2020-04-12 MED ORDER — OXYCODONE HCL 5 MG PO TABS
10.0000 mg | ORAL_TABLET | ORAL | Status: DC | PRN
  Administered 2020-04-12 – 2020-04-14 (×11): 15 mg via ORAL
  Administered 2020-04-15: 5 mg via ORAL
  Administered 2020-04-15: 10 mg via ORAL
  Administered 2020-04-15 – 2020-04-16 (×4): 15 mg via ORAL
  Administered 2020-04-16: 10 mg via ORAL
  Administered 2020-04-16 – 2020-04-20 (×18): 15 mg via ORAL
  Filled 2020-04-12 (×6): qty 3
  Filled 2020-04-12: qty 2
  Filled 2020-04-12 (×6): qty 3
  Filled 2020-04-12: qty 2
  Filled 2020-04-12 (×2): qty 3
  Filled 2020-04-12: qty 2
  Filled 2020-04-12 (×20): qty 3

## 2020-04-12 MED ORDER — BACITRACIN ZINC 500 UNIT/GM EX OINT
TOPICAL_OINTMENT | Freq: Two times a day (BID) | CUTANEOUS | Status: DC
  Filled 2020-04-12: qty 28.4

## 2020-04-12 NOTE — Plan of Care (Signed)
  Problem: RH SAFETY Goal: RH STG ADHERE TO SAFETY PRECAUTIONS W/ASSISTANCE/DEVICE Description: STG Adhere to Safety Precautions With mod I Outcome: Progressing Goal: RH STG DECREASED RISK OF FALL WITH ASSISTANCE Description: STG Decreased Risk of Fall With Mod I Outcome: Progressing   Problem: RH PAIN MANAGEMENT Goal: RH STG PAIN MANAGED AT OR BELOW PT'S PAIN GOAL Description: Less than 4  Outcome: Progressing

## 2020-04-12 NOTE — Progress Notes (Signed)
Inpatient Rehabilitation Medication Review by a Pharmacist  A complete drug regimen review was completed for this patient to identify any potential clinically significant medication issues.  Clinically significant medication issues were identified:  no Time spent performing this drug regimen review (minutes):  10   Noah Delaine, RPh Clinical Pharmacist Please check AMION for all [facility] Pharmacy phone numbers After 10:00 PM, call Main Pharmacy [phone]  04/12/2020 9:40 PM
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
## 2020-04-12 NOTE — TOC Transition Note (Signed)
Transition of Care [facility]) - CM/SW Discharge Note   Patient Details  Name: Susan Graves MRN: [id number] Date of Birth: 1996/10/05  [facility]) CM/SW Contact:  Glennon Mac, RN Phone Number: 04/12/2020, 3:01 PM   Clinical Narrative:   Pt medically stable for discharge and has been accepted for admission to [facility] today.  Pt has 30 day free Eliquis card; provided patient assistance forms for patient to complete and send in for help.  She is instructed to ask her CM/SW on rehab for assistance with forms if needed.  She states she will have her mom follow up with this.     Final next level of care: IP Rehab Facility Barriers to Discharge: Barriers Resolved                       Discharge Plan and Services   Discharge Planning Services: Medication Assistance Post Acute Care Choice: IP Rehab                               Social Determinants of Health (SDOH) Interventions     Readmission Risk Interventions No flowsheet data found.  Quintella Baton, RN, BSN  Trauma/Neuro ICU Case Manager [phone]

## 2020-04-12 NOTE — Progress Notes (Signed)
Inpatient Rehab Admissions Coordinator:   I have a bed available for pt to admit to CIR today.  Barnetta Chapel, PA-C, in agreement.  Will let pt/family and TOC team know.   Estill Dooms, PT, DPT Admissions Coordinator [phone] 04/12/20  9:55 AM

## 2020-04-12 NOTE — Progress Notes (Signed)
Patient transferred to CIR, 4MW-6. Report given to RN. Pt sent with all her belongings.

## 2020-04-13 ENCOUNTER — Inpatient Hospital Stay (HOSPITAL_COMMUNITY): Payer: Medicaid Other

## 2020-04-13 ENCOUNTER — Other Ambulatory Visit: Payer: Self-pay

## 2020-04-13 ENCOUNTER — Inpatient Hospital Stay (HOSPITAL_COMMUNITY): Payer: Medicaid Other | Admitting: Physical Therapy

## 2020-04-13 DIAGNOSIS — S3282XD Multiple fractures of pelvis without disruption of pelvic ring, subsequent encounter for fracture with routine healing: Secondary | ICD-10-CM

## 2020-04-13 LAB — CBC WITH DIFFERENTIAL/PLATELET
Abs Immature Granulocytes: 0.09 10*3/uL — ABNORMAL HIGH (ref 0.00–0.07)
Basophils Absolute: 0.1 10*3/uL (ref 0.0–0.1)
Basophils Relative: 1 %
Eosinophils Absolute: 0.2 10*3/uL (ref 0.0–0.5)
Eosinophils Relative: 3 %
HCT: 28.8 % — ABNORMAL LOW (ref 36.0–46.0)
Hemoglobin: 8.7 g/dL — ABNORMAL LOW (ref 12.0–15.0)
Immature Granulocytes: 2 %
Lymphocytes Relative: 29 %
Lymphs Abs: 1.8 10*3/uL (ref 0.7–4.0)
MCH: 26.3 pg (ref 26.0–34.0)
MCHC: 30.2 g/dL (ref 30.0–36.0)
MCV: 87 fL (ref 80.0–100.0)
Monocytes Absolute: 0.6 10*3/uL (ref 0.1–1.0)
Monocytes Relative: 10 %
Neutro Abs: 3.4 10*3/uL (ref 1.7–7.7)
Neutrophils Relative %: 55 %
Platelets: 852 10*3/uL — ABNORMAL HIGH (ref 150–400)
RBC: 3.31 MIL/uL — ABNORMAL LOW (ref 3.87–5.11)
RDW: 18.3 % — ABNORMAL HIGH (ref 11.5–15.5)
WBC: 6.1 10*3/uL (ref 4.0–10.5)
nRBC: 0 % (ref 0.0–0.2)

## 2020-04-13 LAB — COMPREHENSIVE METABOLIC PANEL
ALT: 67 U/L — ABNORMAL HIGH (ref 0–44)
AST: 38 U/L (ref 15–41)
Albumin: 3.4 g/dL — ABNORMAL LOW (ref 3.5–5.0)
Alkaline Phosphatase: 93 U/L (ref 38–126)
Anion gap: 9 (ref 5–15)
BUN: 16 mg/dL (ref 6–20)
CO2: 28 mmol/L (ref 22–32)
Calcium: 9.4 mg/dL (ref 8.9–10.3)
Chloride: 103 mmol/L (ref 98–111)
Creatinine, Ser: 0.74 mg/dL (ref 0.44–1.00)
GFR calc Af Amer: >60 mL/min (ref >60)
GFR calc non Af Amer: >60 mL/min (ref >60)
Glucose, Bld: 108 mg/dL — ABNORMAL HIGH (ref 70–99)
Potassium: 4.5 mmol/L (ref 3.5–5.1)
Sodium: 140 mmol/L (ref 135–145)
Total Bilirubin: 0.4 mg/dL (ref 0.3–1.2)
Total Protein: 6.8 g/dL (ref 6.5–8.1)

## 2020-04-13 MED ORDER — ACETAMINOPHEN 325 MG PO TABS
650.0000 mg | ORAL_TABLET | Freq: Three times a day (TID) | ORAL | Status: DC
  Administered 2020-04-13 – 2020-04-20 (×20): 650 mg via ORAL
  Filled 2020-04-13 (×21): qty 2
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
## 2020-04-13 NOTE — Progress Notes (Signed)
Patient stated pain when vitals/HR taken at 2352.  Patient also states that they will refuse next set of vitals at 0352, even if we go in to wake them.  Call light within reach, will continue to monitor.
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
## 2020-04-13 NOTE — Plan of Care (Signed)
  Problem: RH Balance Goal: LTG Patient will maintain dynamic sitting balance (PT) Description: LTG:  Patient will maintain dynamic sitting balance with assistance during mobility activities (PT) Flowsheets (Taken 04/13/2020 1647) LTG: Pt will maintain dynamic sitting balance during mobility activities with:: Independent with assistive device  Goal: LTG Patient will maintain dynamic standing balance (PT) Description: LTG:  Patient will maintain dynamic standing balance with assistance during mobility activities (PT) Flowsheets (Taken 04/13/2020 1647) LTG: Pt will maintain dynamic standing balance during mobility activities with:: Independent with assistive device    Problem: Sit to Stand Goal: LTG:  Patient will perform sit to stand with assistance level (PT) Description: LTG:  Patient will perform sit to stand with assistance level (PT) Flowsheets (Taken 04/13/2020 1647) LTG: PT will perform sit to stand in preparation for functional mobility with assistance level: Independent with assistive device   Problem: RH Bed Mobility Goal: LTG Patient will perform bed mobility with assist (PT) Description: LTG: Patient will perform bed mobility with assistance, with/without cues (PT). Flowsheets (Taken 04/13/2020 1647) LTG: Pt will perform bed mobility with assistance level of: Independent with assistive device    Problem: RH Bed to Chair Transfers Goal: LTG Patient will perform bed/chair transfers w/assist (PT) Description: LTG: Patient will perform bed to chair transfers with assistance (PT). Flowsheets (Taken 04/13/2020 1647) LTG: Pt will perform Bed to Chair Transfers with assistance level: Independent with assistive device    Problem: RH Car Transfers Goal: LTG Patient will perform car transfers with assist (PT) Description: LTG: Patient will perform car transfers with assistance (PT). Flowsheets (Taken 04/13/2020 1647) LTG: Pt will perform car transfers with assist:: Supervision/Verbal  cueing   Problem: RH Wheelchair Mobility Goal: LTG Patient will propel w/c in controlled environment (PT) Description: LTG: Patient will propel wheelchair in controlled environment, # of feet with assist (PT) Flowsheets (Taken 04/13/2020 1647) LTG: Pt will propel w/c in controlled environ  assist needed:: Independent with assistive device LTG: Propel w/c distance in controlled environment: 200 ft Goal: LTG Patient will propel w/c in home environment (PT) Description: LTG: Patient will propel wheelchair in home environment, # of feet with assistance (PT). Flowsheets (Taken 04/13/2020 1647) LTG: Pt will propel w/c in home environ  assist needed:: Independent with assistive device LTG: Propel w/c distance in home environment: 50 ft Goal: LTG Patient will propel w/c in community environment (PT) Description: LTG: Patient will propel wheelchair in community environment, # of feet with assist (PT) Flowsheets (Taken 04/13/2020 1647) LTG: Pt will propel w/c in community environ  assist needed:: Supervision/Verbal cueing [id number] w/c distance in community environment: 150 ft   Problem: RH Pre-functional/Other (Specify) Goal: RH LTG PT (Specify) 1 Description:  RH LTG PT (Specify) 1 Flowsheets (Taken 04/13/2020 1647) LTG: Other PT (Specify) 1: At least 1 caregiver will participate in family education and be independent for bumping patient up steps in the w/c for home entry.
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
## 2020-04-13 NOTE — Progress Notes (Signed)
Inpatient Rehabilitation  Patient information reviewed and entered into eRehab system by Kimber Fritts M. Gavina Dildine, M.A., CCC/SLP, PPS Coordinator.  Information including medical coding, functional ability and quality indicators will be reviewed and updated through discharge.    

## 2020-04-14 ENCOUNTER — Inpatient Hospital Stay (HOSPITAL_COMMUNITY): Payer: No Typology Code available for payment source | Admitting: Physical Therapy

## 2020-04-14 ENCOUNTER — Inpatient Hospital Stay (HOSPITAL_COMMUNITY): Payer: No Typology Code available for payment source

## 2020-04-14 ENCOUNTER — Inpatient Hospital Stay (HOSPITAL_COMMUNITY): Payer: Self-pay

## 2020-04-14 DIAGNOSIS — R7401 Elevation of levels of liver transaminase levels: Secondary | ICD-10-CM

## 2020-04-14 DIAGNOSIS — S3282XS Multiple fractures of pelvis without disruption of pelvic ring, sequela: Secondary | ICD-10-CM

## 2020-04-14 DIAGNOSIS — G8918 Other acute postprocedural pain: Secondary | ICD-10-CM
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
## 2020-04-14 NOTE — Progress Notes (Signed)
Per phlebotomy patient refuses lab today.
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
## 2020-04-14 NOTE — Progress Notes (Signed)
Patient express concern re: right lower leg swelling to OT . RN followed up after therapy per patient it is getting much better not of any concern at this moment.

## 2020-04-14 NOTE — Progress Notes (Signed)
Patient ID: Susan Graves, female   DOB: 09-02-1996, 24 y.o.  MRN: [id number]  SW met with pt in room to inform on ELOS and discussed discharge plan. Pt states she will d/c to home with her girlfriend who will provide support. SW informed pt that there will be follow-up with her mother to discuss discharge plan. Pt is hesitant but agreeable for SW to call her mother. SW informed pt on DME that will be ordered and process for getting items under charity care. Pt is amenable to outpatient therapy at [place] spoke with pt mother Katy Apo [phone]) to discuss ELOS and possible d/c for Tuesday or Wednesday. She asked questions with regard to pt needing more rehab. SW informed based on pt ELOS pt was making good gains. SW also reminded about pt NWB restrictions as well. SW made efforts to discuss DME, but unable to address as pt mother was focused on pt not going back to current location, and addtl therapies, and insurance coverage. SW reiterated only being able to address current areas in which pt is uninsured and does not have insurance so resources are limited. She ended call with SW after discussing how pt would get into the home in which she stated that the steps were very steep and it would be hard for someone to bump her up the steps if she is leaving at w/c.   SW spoke with [facility] [phone]) to discuss retro Medicaid. Reports pt is currently listed as Medpay and until this case is closed, it can not be passed along to financial counseling.   SW ordered DME: w/c, TTB, 3in1 BSC, and RW through [place] under charity.   Loralee Pacas, MSW, [place] Office: [phone] Cell: [phone] Fax: [phone]
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
## 2020-04-14 NOTE — Care Management (Signed)
Inpatient Rehabilitation Center Individual Statement of Services  Patient Name:  Susan Graves  Date:  04/14/2020  Welcome to the Inpatient Rehabilitation Center.  Our goal is to provide you with an individualized program based on your diagnosis and situation, designed to meet your specific needs.  With this comprehensive rehabilitation program, you will be expected to participate in at least 3 hours of rehabilitation therapies Monday-Friday, with modified therapy programming on the weekends.  Your rehabilitation program will include the following services:  Physical Therapy (PT), Occupational Therapy (OT), 24 hour per day rehabilitation nursing, Therapeutic Recreaction (TR), Psychology, Neuropsychology, Care Coordinator, Rehabilitation Medicine, Nutrition Services, Pharmacy Services and Other  Weekly team conferences will be held on Tuesdays to discuss your progress.  Your Inpatient Rehabilitation Care Coordinator will talk with you frequently to get your input and to update you on team discussions.  Team conferences with you and your family in attendance may also be held.  Expected length of stay: 4-6 days   Overall anticipated outcome: Independent with an Assistive Device  Depending on your progress and recovery, your program may change. Your Inpatient Rehabilitation Care Coordinator will coordinate services and will keep you informed of any changes. Your Inpatient Rehabilitation Care Coordinator's name and contact numbers are listed  below.  The following services may also be recommended but are not provided by the Inpatient Rehabilitation Center:   Driving Evaluations  Home Health Rehabiltiation Services  Outpatient Rehabilitation Services  Vocational Rehabilitation   Arrangements will be made to provide these services after discharge if needed.  Arrangements include referral to agencies that provide these services.  Your insurance has been verified to be:  Uninsured  Your primary  doctor is:  No PCP  Pertinent information will be shared with your doctor and your insurance company.  Inpatient Rehabilitation Care Coordinator:  Susie Cassette [phone] or [phone]  Information discussed with and copy given to patient by: Gretchen Short, 04/14/2020, 11:34 AM

## 2020-04-15 ENCOUNTER — Inpatient Hospital Stay (HOSPITAL_COMMUNITY): Payer: Medicaid Other

## 2020-04-15 ENCOUNTER — Inpatient Hospital Stay (HOSPITAL_COMMUNITY): Payer: Medicaid Other | Admitting: Physical Therapy

## 2020-04-15 ENCOUNTER — Telehealth: Payer: Self-pay
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
## 2020-04-15 NOTE — Progress Notes (Signed)
Patient ID: Susan Graves, female   DOB: 06/27/1996, 24 y.o.   MRN: [id number]  SW returned phone call to pt who dicussed challenges with getting in contact with [employer]. She also asked questions related to DME. SW explained charity process with [employer]. SW encouraged pt to consider asking family/friends to check community resources such as Building surveyor about needed DME items if she does not have a card to place on file. Pt states [name] is able to come in for family education. SW provided pt with times for therapy already scheduled for Sunday. SW called front desk to add her to list for family edu 8am-3pm on Sunday.    Cecile Sheerer, MSW, LCSWA Office: [phone] Cell: [phone] Fax: [phone]
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
## 2020-04-16 ENCOUNTER — Inpatient Hospital Stay (HOSPITAL_COMMUNITY): Payer: Medicaid Other
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
## 2020-04-16 NOTE — Progress Notes (Signed)
C/o bloody nose; no c/o pain, no visible trauma. Will continue to monitor.

## 2020-04-17 ENCOUNTER — Inpatient Hospital Stay (HOSPITAL_COMMUNITY): Payer: Medicaid Other
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
## 2020-04-17 NOTE — Progress Notes (Signed)
Patient called writer to room and stated that she picked glass from her arm and had more in the other arm but was not able to get it out. Writer advised patient that she should not stick items in her arm to pick out the glass as that could cause and infection. Writer advised patient that she could notify the physician and let him know of the glass but patient refused at this time.

## 2020-04-18 ENCOUNTER — Inpatient Hospital Stay (HOSPITAL_COMMUNITY): Payer: Medicaid Other

## 2020-04-18 NOTE — Progress Notes (Signed)
Occupational Therapy Session Note  Patient Details  Name: Susan Graves MRN: [id number] Date of Birth: Aug 10, 1996  Today's Date: 04/18/2020 OT Individual Time: 3343-5686 OT Individual Time Calculation (min): 55 min    Short Term Goals: Week 1:  OT Short Term Goal 1 (Week 1): STG=LTG d/t ELOS  Skilled Therapeutic Interventions/Progress Updates:    Pt received supine with c/o L shoulder stiffness from the night, requesting to take shower for pain relief. Pt declined donning cam boot and was encouraged to complete lateral scoot transfer to w/c to avoid weight bearing without boot. Pt able to do so with mod I. Pt transferred into shower, requiring min A to doff bra 2/2 shoulder pain. Pt set up for bathing from TTB and required no further assistance other than to wash itchy back. Pt ignored cueing to not stand in shower and stood up to dry peri areas. Pt completed transfer back to w/c with mod I. Pt required min A to don sports bra 2/2 it rolling up in the back. Pt donned underwear nad pants with mod I. Pt requested min A to fix sock d/t pain increasing in hip. Pt able to navigate room in w/c, gathering items for grooming tasks without assistance. Care coordination with PT- pt made mod I in the room- from w/c level for squat pivot transfers only using cam boot- pt edu extensively. While pt completed grooming tasks in room seated, OT created HEP to be reviewed in tomorrow's session. Pt donned cam boot with mod I. Pt completed 200 ft of w/c propulsion with slow pace but mod I overall. Pt given blue theraband for HEP. Pt left sitting in w/c in room, all needs met.    Access Code: [email or web address] URL: [email or web address] Date: 04/18/2020 Prepared by: Laverle Hobby  Exercises Single Arm Doorway Pec Stretch at 90 Degrees Abduction - 1 x daily - 7 x weekly - 3 sets - 10 reps Seated Shoulder External Rotation PROM on Table - 1 x daily - 7 x weekly - 3 sets - 10 reps Standing Shoulder and Trunk  Flexion at Table - 1 x daily - 7 x weekly - 3 sets - 10 reps Standing Shoulder External Rotation Stretch in Doorway - 1 x daily - 7 x weekly - 3 sets - 10 reps Standing Overhead Triceps Stretch - 1 x daily - 7 x weekly - 3 sets - 10 reps Shoulder PNF D2 with Resistance - 1 x daily - 7 x weekly - 3 sets - 10 reps Standing Shoulder Flexion with Resistance - 1 x daily - 7 x weekly - 3 sets - 10 reps Standing Single Arm Shoulder Abduction with Resistance - 1 x daily - 7 x weekly - 3 sets - 10 reps Seated Shoulder Row with Anchored Resistance - 1 x daily - 7 x weekly - 3 sets - 10 reps Seated Shoulder Diagonal Pulls with Resistance - 1 x daily - 7 x weekly - 3 sets - 10 reps Seated Shoulder W External Rotation on Swiss Ball - 1 x daily - 7 x weekly - 3 sets - 10 reps  Therapy Documentation Precautions:  Precautions Precautions: Fall Precaution Booklet Issued: No Precaution Comments: TP fractures; scalp laceration; L elbow laceration; several areas of road rash Required Braces or Orthoses: Other Brace Other Brace: CAM boot RLE Restrictions Weight Bearing Restrictions: Yes RLE Weight Bearing: Weight bearing as tolerated LLE Weight Bearing: Non weight bearing   Therapy/Group: Individual Therapy  Curtis Sites 04/18/2020, 6:37 AM

## 2020-04-18 NOTE — Progress Notes (Addendum)
Physical Therapy Session Note  Patient Details  Name: Susan Graves MRN: [id number] Date of Birth: Mar 07, 1996  Today's Date: 04/18/2020 PT Individual Time: 3474-2595 PT Individual Time Calculation (min): 132 min   Short Term Goals: Week 1:  PT Short Term Goal 1 (Week 1): STG=LTG due to ELOS.  Skilled Therapeutic Interventions/Progress Updates:     Patient in bed upon PT arrival. Patient alert and agreeable to PT session. Patient reported 7/10 pelvic and R LE pain during session, RN made aware. PT provided repositioning, rest breaks, and distraction as pain interventions throughout session.   Therapeutic Activity: Bed Mobility: Patient performed supine to sit with mod I with use of hospital bed functions and supine to/form sit independently on a mat table. Patient donned/doffed CAM boot with mod A during session. Doffed CAM boot for exercises only during session. Transfers: Patient performed squat pivot transfers bed>w/c, w/c<>BSC, and w/c<>mat table with mod I. Patient continues to perform TDWB with her L LE despite previous education, patient states she is aware of her precautions and risks associated with weight bearing through her L LE. Patient performed a simulated van height car transfer with supervision using RW. Provided cues for safe technique, and demonstration with maintaining LE weight bearing precautions.   Wheelchair Mobility:  Patient propelled wheelchair around unit with mod I. Demonstrated ability to don/doff leg rests independently, however, provided assist throughout session for energy/pain conservation and time management. Patient was independent with use of breaks throughout session.  Therapeutic Exercise: Patient performed the following exercises provided with HEP handout during session with verbal and tactile cues for proper technique. Access Code: [email or web address]:  Supine Ankle Pumps - 2 x daily - 7 x weekly - 2 sets - 10 reps  Supine Heel Slide - 2 x daily - 7 x weekly  - 2 sets - 10 reps  Quad set with towel Roll - 2 x daily - 7 x weekly - 2 sets - 10 reps - 5 sec hold  Supine Hip Abduction - 2 x daily - 7 x weekly - 2 sets - 10 reps  Seated Long Arc Quad - 2 x daily - 7 x weekly - 2 sets - 10 reps Also reviewed ~1/2 of UE HEP from OT, patient with minimal carryover for technique, reports due to "fogginess" from pain medicine.  -Single Arm Doorway Pec Stretch at 90 Degrees Abduction - 1 x daily - 7 x weekly - 3 sets - 10 reps -Seated Shoulder External Rotation PROM on Table - 1 x daily - 7 x weekly - 3 sets - 10 reps -Standing Shoulder and Trunk Flexion at Table - 1 x daily - 7 x weekly - 3 sets - 10 reps -Standing Shoulder External Rotation Stretch in Doorway - 1 x daily - 7 x weekly - 3 sets - 10 reps -Seated Overhead Triceps Stretch - 1 x daily - 7 x weekly - 3 sets - 10 reps -Shoulder PNF D2 with Resistance - 1 x daily - 7 x weekly - 3 sets - 10 reps  Patient required increased time for all mobility and activities during session due to increased LE soreness and frequent interruptions from phone calls. Redirected patient to prioritize therapy and reduce phone use during sessions.   Patient showed therapist 3-4 new lesions on her legs, RN made aware. 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 RN to consult PA. Utilized bleach wipes to wipe equipment during session.   Patient in  w/c in her room at end of session with breaks locked and all needs within reach.    Therapy Documentation Precautions:  Precautions Precautions: Fall Precaution Booklet Issued: No Precaution Comments: NWB LLE, WBAT in cam boot RLE Required Braces or Orthoses: Other Brace Other Brace: CAM boot RLE Restrictions Weight Bearing Restrictions: Yes RLE Weight Bearing: Weight bearing as tolerated LLE Weight Bearing: Non weight bearing   Therapy/Group: Individual Therapy  Khloei Spiker L Mileigh Tilley PT, DPT  04/18/2020, 4:43 PM

## 2020-04-18 NOTE — Progress Notes (Signed)
Patient c/o increased pain. In to assess patient refused to be touched d/t pain, states she can't even describe the pain but it feels deep, Patient observed grimacing and tearful on the phone with her mom.Marland Kitchen PRN oxycodone and tylenol given prior to complaint. Patient refused Robaxin states that it makes her feel funny. Patient given heat packs and reports some relief. Riley Lam NP on call and notified. New order for [employer].

## 2020-04-18 NOTE — Progress Notes (Signed)
Occupational Therapy Discharge Summary  Patient Details  Name: Susan Graves MRN: [id number] Date of Birth: [birth year]  Today's Date: 04/19/2020 OT Individual Time: 0865-7846 OT Individual Time Calculation (min): 60 min    Patient has met 8 of 9 long term goals due to improved activity tolerance, improved balance, postural control, ability to compensate for deficits and improved awareness.  Patient to discharge at overall Modified Independent level.  Patient reports her care partner is independent to provide the necessary physical assistance at discharge, however no family has come in for family education despite it being scheduled. Pt reports she can carryover any education needed. Also of note- pt demonstrates poor compliance of LLE NWB precautions and need to wear cam boot despite copious amounts of education. Pt frequently goes against education/instruction of staff.   Reasons goals not met: Pt did not meet her independent standing balance goal 2/2 poor compliance of LLE NWB precautions. Pt has been instructed to complete squat pivot transfers only.   Recommendation:  No follow up required  Equipment: No equipment provided  Reasons for discharge: treatment goals met and discharge from hospital  Patient/family agrees with progress made and goals achieved: Yes   Skilled OT Intervention: Pt received supine with high amounts of pain in her pelvis and stating "I have MRSA from the washing machine". Pt agreeable to shower for pain relief.  Edu provided on importance of pt not continuing to pick at open wounds and to self- extract glass. Pt also stating "guess I should have listened to y'all about standing up" referring to non-compliance of LLE NWB and frequent refusal of cam boot. Pt completed squat pivot transfer to the w/c, refusing to don cam boot. Pt transferred into shower with mod I. Pt assisted in propping up BLE on trash can for comfort. Pt completed all bathing at mod I level. Pt  requested extra assistance for dressing 2/2 pain but her usual performance is mod I. Pt completed grooming tasks at the sink with mod I from w/c level. Pt reported her mother would be coming in today and edu was reinforced re ADLs and transfers. Pt left sitting up in the w/c with all needs met.   Pushpin found in [facility] with blood on toilet paper, pt not answering OT when asked what pushpin what used for. Pt also picking at scabs throughout session despite cueing to stop.   OT Discharge Precautions/Restrictions  Precautions Precautions: Fall Precaution Comments: NWB LLE, WBAT in cam boot RLE Required Braces or Orthoses: Other Brace Other Brace: CAM boot RLE Restrictions Weight Bearing Restrictions: Yes RLE Weight Bearing: Weight bearing as tolerated LLE Weight Bearing: Non weight bearing Pain Pain Assessment Pain Scale: 0-10 Pain Score: 1  ADL ADL Eating: Independent Grooming: Independent Where Assessed-Grooming: Wheelchair Upper Body Bathing: Modified independent Where Assessed-Upper Body Bathing: Shower Lower Body Bathing: Modified independent Where Assessed-Lower Body Bathing: Shower Upper Body Dressing: Modified independent (Device) Where Assessed-Upper Body Dressing: Wheelchair Lower Body Dressing: Modified independent Where Assessed-Lower Body Dressing: Wheelchair Toileting: Modified independent Where Assessed-Toileting: Bedside Commode Toilet Transfer: Modified independent Armed forces technical officer Method: Engineer, water: Radiographer, therapeutic: Modified independent Clinical cytogeneticist Method: Engineer, production: Gaffer Baseline Vision/History: No visual deficits Patient Visual Report: No change from baseline Vision Assessment?: No apparent visual deficits Perception  Perception: Within Functional Limits Praxis Praxis: Intact Cognition Overall Cognitive Status: Within Functional Limits for tasks  assessed Arousal/Alertness: Awake/alert Orientation Level: Oriented X4 Focused Attention: Appears intact Sustained Attention:  Appears intact Memory: Appears intact Awareness: Appears intact Problem Solving: Appears intact Safety/Judgment: Appears intact Comments: Pt chooses to not adhere to WB precautions- ample education provided, suspect baseline medical noncompliance Sensation Sensation Light Touch: Impaired by gross assessment (some numbness present along B thighs and feet) Proprioception: Appears Intact Coordination Gross Motor Movements are Fluid and Coordinated: No Fine Motor Movements are Fluid and Coordinated: Yes Coordination and Movement Description: Polytrauma and weight bearing restrictions limiting mobility Finger Nose Finger Test: [facility] Motor  Motor Motor: Abnormal postural alignment and control Motor - Discharge Observations: limited by weightbearing precautions Mobility  Bed Mobility Bed Mobility: Supine to Sit;Sit to Supine Supine to Sit: Independent Sit to Supine: Independent  Trunk/Postural Assessment  Cervical Assessment Cervical Assessment: Within Functional Limits Thoracic Assessment Thoracic Assessment: Within Functional Limits Lumbar Assessment Lumbar Assessment: Within Functional Limits Postural Control Postural Control: Within Functional Limits (WFL within allowable weightbearing limits)  Balance Balance Balance Assessed: Yes Static Sitting Balance Static Sitting - Balance Support: No upper extremity supported;Feet unsupported Static Sitting - Level of Assistance: 7: Independent Dynamic Sitting Balance Dynamic Sitting - Balance Support: No upper extremity supported;Feet supported Dynamic Sitting - Level of Assistance: 7: Independent Static Standing Balance Static Standing - Balance Support: Bilateral upper extremity supported Static Standing - Level of Assistance: 6: Modified independent (Device/Increase time) Dynamic Standing Balance Dynamic  Standing - Balance Support: Left upper extremity supported;Right upper extremity supported;During functional activity Dynamic Standing - Level of Assistance: 6: Modified independent (Device/Increase time) Dynamic Standing - Comments: Unable to maintain NWB Extremity/Trunk Assessment RUE Assessment RUE Assessment: Within Functional Limits LUE Assessment LUE Assessment: Exceptions to [facility] General Strength Comments: Pt has developed some stiffness/limitations in L shoulder, tightness in ER especially   Curtis Sites 04/18/2020, 9:57 AM

## 2020-04-18 NOTE — Progress Notes (Signed)
Physical Therapy Note  Patient Details  Name: Susan Graves MRN: [id number] Date of Birth: 02-22-1996 Today's Date: 04/18/2020    Patient asleep in the room upon PT arrival. Patient easily aroused and requested PT to come back in 30 min to allow her to sleep. PT returned 30 min later and patient asleep in bed. Again patient was easily aroused, but continued to ask to sleep this morning due to fatigue. Reported that she slept well last night, but feeling tired/fatigued this morning. PT will attempt to adjust schedule to make up missed time with patient this afternoon.    Ardit Danh L Adaliz Dobis PT, DPT  04/18/2020, 10:37 AM

## 2020-04-18 NOTE — Progress Notes (Signed)
Patient ID: Susan Graves, female   DOB: 13-Feb-1996, 24 y.o.   MRN: [id number]  SW met with pt in room to discuss DME, reports that her mother is going to get all DME she will need. Pt states her mother will pick her up at d/c, and she will d/c to her cousin's home.   Loralee Pacas, MSW, [place] Office: [phone] Cell: [phone] Fax: [phone]

## 2020-04-18 NOTE — Progress Notes (Signed)
Pt's mother called angry that pt was in pain. Pt has not notified this nurse about increased pain. Pt stating her pain is 10/10 over pelvic region. Jacalyn Lefevre NP notified. No new orders. Pt and mother notified of results. Pt's mother wants practitioner at bedside, and is threatening to take pt to "[facility]". Instructed mother and pt that her pain medication will be due in an hour. Pt given heat to ease pain until it is due. Mother also reports that pt was some "bumps" on her. Pt showed nurse small bumps on her legs that pt states "I got them from the laundry here" and that the previous shift was notified and is aware. Pt's mother and pt are agreeable to current plan of care and pain management.
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
## 2020-04-18 NOTE — Progress Notes (Signed)
Pt has requested removal of KPAD.

## 2020-04-19 ENCOUNTER — Inpatient Hospital Stay (HOSPITAL_COMMUNITY): Payer: Medicaid Other

## 2020-04-19 ENCOUNTER — Encounter (HOSPITAL_COMMUNITY): Payer: Medicaid Other | Admitting: Psychology

## 2020-04-19 DIAGNOSIS — S82141A Displaced bicondylar fracture of right tibia, initial encounter for closed fracture: Secondary | ICD-10-CM

## 2020-04-19 DIAGNOSIS — M25512 Pain in left shoulder: Secondary | ICD-10-CM

## 2020-04-19 LAB — URINE CULTURE: Culture: NO GROWTH

## 2020-04-19 LAB — GC/CHLAMYDIA PROBE AMP (~~LOC~~) NOT AT ARMC
Chlamydia: NEGATIVE
Comment: NEGATIVE
Comment: NORMAL
Neisseria Gonorrhea: NEGATIVE

## 2020-04-19 MED ORDER — POLYETHYLENE GLYCOL 3350 17 G PO PACK: 17.0000 g | PACK | Freq: Every day | ORAL | 0 refills | Status: DC

## 2020-04-19 MED ORDER — GABAPENTIN 100 MG PO CAPS: 200.0000 mg | ORAL_CAPSULE | Freq: Three times a day (TID) | ORAL | 0 refills | Status: DC

## 2020-04-19 MED ORDER — VITAMIN D3 25 MCG PO TABS: 2000.0000 [IU] | ORAL_TABLET | Freq: Every day | ORAL | 0 refills | Status: DC

## 2020-04-19 MED ORDER — CEPHALEXIN 250 MG PO CAPS: 250.0000 mg | ORAL_CAPSULE | Freq: Three times a day (TID) | ORAL | 0 refills | Status: DC

## 2020-04-19 MED ORDER — DOCUSATE SODIUM 100 MG PO CAPS: 100.0000 mg | ORAL_CAPSULE | Freq: Two times a day (BID) | ORAL | 0 refills | Status: DC

## 2020-04-19 MED ORDER — METHOCARBAMOL 500 MG PO TABS: 1000.0000 mg | ORAL_TABLET | Freq: Three times a day (TID) | ORAL | 0 refills | Status: DC

## 2020-04-19 MED ORDER — APIXABAN 5 MG PO TABS: 5.0000 mg | ORAL_TABLET | Freq: Two times a day (BID) | ORAL | 0 refills | Status: DC

## 2020-04-19 MED ORDER — CEPHALEXIN 250 MG PO CAPS
250.0000 mg | ORAL_CAPSULE | Freq: Three times a day (TID) | ORAL | Status: DC
  Administered 2020-04-19 – 2020-04-20 (×3): 250 mg via ORAL
  Filled 2020-04-19 (×4): qty 1

## 2020-04-19 MED FILL — VITAMIN D3 1,000 UNIT TAB: 25 MCG | 30 days supply | Qty: 60 | Fill #0

## 2020-04-19 MED FILL — ELIQUIS 5 MG TABLET: 5 | 30 days supply | Qty: 60 | Fill #0

## 2020-04-19 MED FILL — GABAPENTIN 100 MG CAPSULE: 100 | 15 days supply | Qty: 90 | Fill #0
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
## 2020-04-19 NOTE — Progress Notes (Signed)
Physical Therapy Note  Patient Details  Name: Susan Graves MRN: [id number] Date of Birth: 1995-11-02 Today's Date: 04/19/2020    Pt missed 30 min of skilled PT. Pt declines therapy session stating she hurts too much all over and finally got into a position where she isn't in pain. Did not rate but reports LUE pain the greatest. Already had meds per report. Denies any concerns in regards to upcoming d/c tomorrow. Suggested to review HEP and work on strengthening/ROM but also declines. All needs in reach.    Karolee Stamps Darrol Poke, PT, DPT, CBIS  04/19/2020, 11:15 AM
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
## 2020-04-19 NOTE — Progress Notes (Signed)
Writer spoke with patients mother who stated that some older woman was in her room being rude to [place] and her call light will not work now. She stated that she disabled the call light. Writer assure the mother that the call light could not be disabled and that I would go check it as soon as we hung up. Patients mother stated that she wanted the Doctor to call her in the morning before she comes to pick Dante up because she feels she is not ready for discharge and wants her transferred to [facility]. She also stated that her daughter caught MRSA here and no one did anything about it. This Clinical research associate informed the mother that a note was left for the doctor and was address this morning with the doctor with orders in place at this time. ]   The mother would like for the Doctor to call her in the morning at: [phone].   Writer did go to the patient's room and check the call light for her. The call light on the bed is working as well as the remote one. Will notify charge nurse and night nurse of needed phone call to the mother.

## 2020-04-19 NOTE — Progress Notes (Signed)
Physical Therapy Note  Patient Details  Name: Susan Graves MRN: [id number] Date of Birth: January 15, 1996 Today's Date: 04/19/2020    Patient in bed asleep upon PT arrival. Patient easily aroused, however reported 10/10 pelvic and R LE pain and stated she could not participate in therapy at this time, RN made aware and providing pain medicine. PT discussed d/c planning, concerns about joint/muscle stiffness/soreness if patient remains in the bed today. Patient continued to decline performing mobility or bed level exercises due to pain. Provided and reviewed handouts for bumping up steps in the w/c with 1-2 person assist. Placed handouts with personal belongings. Patient asked to be "left alone" to rest due to elevated pain. Patient missed 60 min of skilled PT due to pain, RN made aware. Will attempt to make-up missed time as able.       Terria Deschepper L Sheran Newstrom PT, DPT  04/19/2020, 1:18 PM

## 2020-04-19 NOTE — Progress Notes (Signed)
Patient ID: Susan Graves, female   DOB: Feb 13, 1996, 24 y.o.   MRN: [id number]  Sw faxed outpatient PT/OT referral to [facility] (P:[phone]/f:[phone]).  New patient appointment/hospital follow-up scheduled with [facility] on Saturday July 9 at 9:40am with Phil Dopp.   *SW met with pt in room to discuss d/c above. Pt was sleeping and unsure how much information pt grasped from SW while giving updates on above. SW left appointment card in room. SW renmin  Pt set up for [facility].  D/c instructions updated.  Loralee Pacas, MSW, [place] Office: [phone] Cell: [phone] Fax: [phone]

## 2020-04-19 NOTE — Progress Notes (Signed)
Patient has elevated heart rate, this is a norm for her. Patient has no distress noted. Will give PRN pain meds for increased pain and continue with Q 4 hr vitals.Dan,PA is aware and no new orders at this time.

## 2020-04-19 NOTE — Progress Notes (Signed)
Patient has been in bed most of the afternoon stating that she is having pain 10/10. PRN medication has been provided, lights are out. Curtains are closed per patient request. Patient noted to be on the phone and staff can hear yelling outside of patients room. This has happened a couple of times today so far. Patient has called again complaining of pain, K-pad is in place, room is cool and dim, advised patient PRN medication is not due at this time and encouraged patient to rest.

## 2020-04-19 NOTE — Discharge Instructions (Signed)
Inpatient Rehab Discharge Instructions  Susan Graves Discharge date and time: No discharge date for patient encounter.   Activities/Precautions/ Functional Status: Activity: Weightbearing as tolerated right lower extremity with Cam boot.  Nonweightbearing left lower extremity and no range of motion restrictions Diet: regular diet Wound Care: keep wound clean and dry Functional status:  ___ No restrictions     ___ Walk up steps independently ___ 24/7 supervision/assistance   ___ Walk up steps with assistance ___ Intermittent supervision/assistance  ___ Bathe/dress independently ___ Walk with walker     _x__ Bathe/dress with assistance ___ Walk Independently    ___ Shower independently ___ Walk with assistance    ___ Shower with assistance ___ No alcohol     ___ Return to work/school ________  COMMUNITY REFERRALS UPON DISCHARGE:    Outpatient: PT    OT             Agency: [facility] Phone: [phone]              Appointment Date/Time:*Please expect follow-up within 7-10 business days to schedule your visit. If you have not received follow-up, be sure to contact the center. BE SURE TO DISCUSS FINANCIAL ASSISTANCE AT TIME OF APPOINTMENT AS THEY WILL DISCUSS A PAYMENT PLAN FOR SERVICES.*  Medical Equipment/Items Ordered: wheelchair, rolling walker                                                 Agency/Supplier: Pt to obtain items  GENERAL COMMUNITY RESOURCES FOR PATIENT/FAMILY: New patient appointment/hospital follow-up: Saturday, July 10 at 9:40am with [facility] [address] [place], [place] [address] [phone] Be sure to bring your picture ID and bring letter from family member  Special Instructions: No driving smoking or alcohol   My questions have been answered and I understand these instructions. I will adhere to these goals and the provided educational materials after my discharge from the  hospital.  Patient/Caregiver Signature _______________________________ Date __________  Clinician Signature _______________________________________ Date __________  Please bring this form and your medication list with you to all your follow-up doctor's appointments.

## 2020-04-19 NOTE — Progress Notes (Signed)
Pt slept well from 2230-0415.

## 2020-04-19 NOTE — Progress Notes (Signed)
Physical Therapy Note Missed time 75 min  Patient Details  Name: Susan Graves MRN: [id number] Date of Birth: 11/14/1995 Today's Date: 04/19/2020    Pt refused am session.  Pt stated she just got into bed due to pain and could not get up or participate at this time.  Educated pt re: need to instruct w/bumping wc on stairs but pt continued to refuse all treatment.  Stated "I didn't expect you this early".  When referring to printed schedule pt states "I don't pay attention to that thing".  Unable to convince pt of need to participate.   Shearon Balo 04/19/2020, 12:36 PM
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
## 2020-04-19 NOTE — Progress Notes (Signed)
Physical Therapy Discharge Summary  Patient Details  Name: Susan Graves MRN: [id number] Date of Birth: 1995/12/21  Today's Date: 04/19/2020      Patient has met 9 of 10 long term goals due to improved activity tolerance, improved balance, improved postural control, increased strength, increased range of motion, decreased pain and ability to compensate for deficits.  Patient to discharge at a wheelchair level Modified Independent.   Patient's care partner unavailable for family education, patient able to direct assist as needed for the necessary physical assistance at discharge.  Reasons goals not met: Patient's family did not come for family education for stair training. Provided handouts and education to patient for bumping up steps in the w/c.   Recommendation:  Patient will benefit from ongoing skilled PT services in outpatient setting to continue to advance safe functional mobility, address ongoing impairments in strength/ROM, functional mobility, activity tolerance, pain, sensation, safety awareness, patient/caregiver education, and minimize fall risk.  Equipment: No equipment provided, patient chose to obtain RW and wheelchair on her own, educated on size and safety recommendations for equipment selection  Reasons for discharge: treatment goals met  Patient/family agrees with progress made and goals achieved: Yes   Patient refused to participate in final PT sessions on last day of therapy due to pain. Confirmed with MD that patient is cleared for d/c at this time.   PT Discharge Precautions/Restrictions Precautions Precautions: Fall Precaution Comments: NWB LLE, WBAT in cam boot RLE Required Braces or Orthoses: Other Brace Other Brace: CAM boot RLE Restrictions RLE Weight Bearing: Weight bearing as tolerated LLE Weight Bearing: Non weight bearing Vision/Perception  Vision - Assessment Eye Alignment: Within Functional Limits Alignment/Gaze Preference: Within Defined  Limits Saccades: Within functional limits Perception Perception: Within Functional Limits Praxis Praxis: Intact  Cognition Overall Cognitive Status: Within Functional Limits for tasks assessed Arousal/Alertness: Awake/alert Focused Attention: Appears intact Sustained Attention: Appears intact Memory: Appears intact Awareness: Appears intact Problem Solving: Appears intact Safety/Judgment: Appears intact Comments: Pt chooses to not adhere to WB precautions- ample education provided, suspect baseline medical noncompliance Sensation Sensation Light Touch: Impaired by gross assessment (numbness on the lateral sides of legs and tops of feet) Light Touch Impaired Details: Impaired RLE;Impaired LLE Proprioception: Appears Intact Coordination Gross Motor Movements are Fluid and Coordinated: No Fine Motor Movements are Fluid and Coordinated: Yes Coordination and Movement Description: Polytrauma and weight bearing restrictions limiting mobility Heel Shin Test: unable due to pelvic pain Motor  Motor Motor: Abnormal postural alignment and control Motor - Discharge Observations: limited by weightbearing precautions  Mobility Bed Mobility Bed Mobility: Supine to Sit;Sit to Supine;Rolling Right;Rolling Left Rolling Right: Independent Rolling Left: Independent Supine to Sit: Independent Sit to Supine: Independent Transfers Transfers: Set designer Transfers;Sit to Stand;Stand to Sit;Stand Pivot Transfers Sit to Stand: Independent with assistive device Stand to Sit: Independent with assistive device Stand Pivot Transfers: Independent with assistive device Stand Pivot Transfer Details (indicate cue type and reason): Patient non-compliant with L LE NWB precutions, maintains TDWB, patient aware of precautions and heavily educated throughout stay, patient chosing not to comply Transfer (Assistive device): Rolling walker Locomotion  Gait Ambulation: No (unsafe due to nonadherence to weight  bearing precautions) Gait Gait: No Stairs / Additional Locomotion Stairs: No (Patient to bumped up steps in w/c by caregivers as needed, handouts provided and patient educated on technique) Ramp: Supervision/Verbal cueing (in w/c) Curb: Dependent - Patient 0% (in w/c) Product manager Mobility: Yes Wheelchair Assistance: Independent with assistive device Wheelchair Propulsion: Both upper extremities  Wheelchair Parts Management: Independent Distance: >250 ft  Trunk/Postural Assessment  Cervical Assessment Cervical Assessment: Within Functional Limits Thoracic Assessment Thoracic Assessment: Within Functional Limits Lumbar Assessment Lumbar Assessment: Within Functional Limits Postural Control Postural Control: Within Functional Limits (WFL within allowable weightbearing limits)  Balance Balance Balance Assessed: Yes Static Sitting Balance Static Sitting - Balance Support: No upper extremity supported;Feet unsupported Static Sitting - Level of Assistance: 7: Independent Dynamic Sitting Balance Dynamic Sitting - Balance Support: No upper extremity supported;Feet supported Dynamic Sitting - Level of Assistance: 7: Independent Static Standing Balance Static Standing - Balance Support: Bilateral upper extremity supported Static Standing - Level of Assistance: 6: Modified independent (Device/Increase time) Dynamic Standing Balance Dynamic Standing - Balance Support: Left upper extremity supported;Right upper extremity supported;During functional activity Dynamic Standing - Level of Assistance: 6: Modified independent (Device/Increase time) Extremity Assessment  RLE Assessment RLE Assessment: Exceptions to [facility] Active Range of Motion (AROM) Comments: knee flexion >90 degrees, extension to 0 degress, ankle DF/PF slow and painful with limited ROM, hip flexion >90 degrees General Strength Comments: Grossly at least 3+/5 througout, unable to perform formal strength testing  due to pain LLE Assessment LLE Assessment: Exceptions to [facility] Active Range of Motion (AROM) Comments: WFL, hip flexion limited, but improved >90 degrees General Strength Comments: Grossly at least 3+/5 througout, unable to perform formal strength testing due to pain     L  PT, DPT  04/19/2020, 6:38 AM

## 2020-04-20 ENCOUNTER — Inpatient Hospital Stay (HOSPITAL_COMMUNITY): Payer: Medicaid Other

## 2020-04-20 MED ORDER — CEPHALEXIN 250 MG PO CAPS: 250.0000 mg | ORAL_CAPSULE | Freq: Four times a day (QID) | ORAL | 0 refills | Status: DC

## 2020-04-20 MED ORDER — METHOCARBAMOL 500 MG PO TABS: 500.0000 mg | ORAL_TABLET | Freq: Three times a day (TID) | ORAL | 0 refills | Status: DC

## 2020-04-20 MED ORDER — OXYCODONE HCL 10 MG PO TABS: 10.0000 mg | ORAL_TABLET | ORAL | 0 refills | Status: DC | PRN

## 2020-04-20 MED ORDER — METHOCARBAMOL 500 MG PO TABS: 500.0000 mg | ORAL_TABLET | Freq: Three times a day (TID) | ORAL | Status: DC

## 2020-04-20 MED ORDER — CEPHALEXIN 250 MG PO CAPS: 250.0000 mg | ORAL_CAPSULE | Freq: Four times a day (QID) | ORAL | Status: DC

## 2020-04-20 MED FILL — METHOCARBAMOL 500 MG TABS: 500 | 20 days supply | Qty: 60 | Fill #0

## 2020-04-20 MED FILL — CEPHALEXIN 250 MG CAPSULE: 250 | 7 days supply | Qty: 28 | Fill #0

## 2020-04-20 MED FILL — oxyCODONE HCL 10 MG TABS: 10 | 4 days supply | Qty: 30 | Fill #0
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
## 2020-04-20 NOTE — Progress Notes (Signed)
Patient ID: Susan Graves, female   DOB: [birth year], 24 y.o.   MRN: [id number]  SW received messages from patient's mother Arville Go [phone]) calling about questions about w/c, RW, and therapies, and then a second message indicating that she was not coming to pick up the pt. SW discussed with nursing staff and was informed pt was d/c and someone came to pick her up. This SW was on a webinar during time of calls.  *SW made several attempts to return phone call to pt mother but voicemail is full.   Cecile Sheerer, MSW, LCSWA Office: [phone] Cell: [phone] Fax: [phone]

## 2020-04-20 NOTE — Progress Notes (Signed)
Inpatient Rehabilitation Care Coordinator  Discharge Note  The overall goal for the admission was met for:   Discharge location: Yes. D/c to cousin's home.   Length of Stay: Yes. 7 days.   Discharge activity level: Yes. Mod I w/c level.  Home/community participation: Yes. Limited.  Services provided included: MD, RD, PT, OT, RN, CM, TR, Pharmacy, Neuropsych and SW  Financial Services: Other: Uninsured  Follow-up services arranged: Outpatient: [facility] for Outpatient PT/OT and DME: Pt to obtain items needed: w/c, RW, 3in1 BSC  Comments (or additional information): there is no contact number for pt other than mother's number listed. Thomasene Lot [phone]  New patient appointment/hospital follow-up: Saturday, July 10 at 9:40am with [facility] [address] [place], [place] [address] [phone]  Patient/Family verbalized understanding of follow-up arrangements: Yes  Individual responsible for coordination of the follow-up plan: pt to have assistance with care needs   Confirmed correct DME delivered: Rana Snare 04/20/2020    Rana Snare

## 2020-04-20 NOTE — Progress Notes (Signed)
Patient left facility via wheelchair with family member at her side.  Discharge instructions were given and patient verbalized understanding. Medications were brought up to the floor and given to the patient as well.  No obvious acute distress or discomfort noted at time of discharge.

## 2020-04-20 NOTE — Progress Notes (Signed)
Patient admitted. Stable and comfortable in bed.  **late entry**

## 2020-04-20 NOTE — Plan of Care (Signed)
Care plan goals have been met.

## 2020-04-21 NOTE — Telephone Encounter (Signed)
Return call by patient.  Voicemail left on my desk phone that she was at [facility] in rehab.  Her number is [phone] Room # [phone].  She is not sure if that is how to reach her or not. Hart Carwin, RN

## 2020-04-21 NOTE — Telephone Encounter (Signed)
Telephone call to patient at [facility] number provided on the voicemail and no answer.  PAP letter mailed today asking patient to please call and schedule her PAP appointment once she is discharged from the hospital/rehab. Hart Carwin, RN

## 2020-05-08 ENCOUNTER — Emergency Department
Admission: EM | Admit: 2020-05-08 | Discharge: 2020-05-08 | Disposition: A | Discharge status: Home / Self Care | Payer: Medicaid Other | Attending: Emergency Medicine | Admitting: Emergency Medicine

## 2020-05-08 ENCOUNTER — Encounter: Payer: Self-pay | Admitting: Emergency Medicine

## 2020-05-08 ENCOUNTER — Other Ambulatory Visit: Payer: Self-pay

## 2020-05-08 DIAGNOSIS — F17210 Nicotine dependence, cigarettes, uncomplicated: Secondary | ICD-10-CM | POA: Insufficient documentation

## 2020-05-08 DIAGNOSIS — Z760 Encounter for issue of repeat prescription: Secondary | ICD-10-CM | POA: Insufficient documentation

## 2020-05-08 DIAGNOSIS — F909 Attention-deficit hyperactivity disorder, unspecified type: Secondary | ICD-10-CM | POA: Insufficient documentation

## 2020-05-08 MED ORDER — TRAMADOL HCL 50 MG PO TABS: 50.0000 mg | ORAL_TABLET | Freq: Four times a day (QID) | ORAL | 0 refills | Status: AC | PRN

## 2020-05-08 MED ORDER — ONDANSETRON 4 MG PO TBDP
4.0000 mg | ORAL_TABLET | Freq: Once | ORAL | Status: DC
  Filled 2020-05-08: qty 1

## 2020-05-08 MED ORDER — ONDANSETRON 4 MG PO TBDP: 4.0000 mg | ORAL_TABLET | Freq: Three times a day (TID) | ORAL | 0 refills | Status: AC | PRN

## 2020-05-08 MED ORDER — OXYCODONE-ACETAMINOPHEN 5-325 MG PO TABS
1.0000 | ORAL_TABLET | Freq: Once | ORAL | Status: AC
  Administered 2020-05-08: 1 via ORAL
  Filled 2020-05-08: qty 1
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
## 2020-06-03 ENCOUNTER — Ambulatory Visit: Payer: Medicaid Other

## 2020-06-06 ENCOUNTER — Ambulatory Visit: Payer: Medicaid Other

## 2020-06-08 ENCOUNTER — Ambulatory Visit: Payer: Medicaid Other

## 2020-06-13 ENCOUNTER — Telehealth: Payer: Self-pay
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
## 2020-07-11 ENCOUNTER — Telehealth: Payer: Self-pay

## 2020-07-11 NOTE — Telephone Encounter (Signed)
Telephone call to patient today regarding the need for a repeat PAP and physical due August 2021.  Spoke to her Mother (listed as emergency contact).  Mom reports she has moved to [place].  Mom will have patient call me back and if she cannot, she will have her get in contact with her local health department for her annual check up (due in August).  Close to PAP follow-up.  Hart Carwin, RN

## 2020-11-17 ENCOUNTER — Ambulatory Visit (HOSPITAL_COMMUNITY)
Admission: AD | Admit: 2020-11-17 | Discharge: 2020-11-17 | Disposition: A | Discharge status: Home / Self Care | Payer: Self-pay | Attending: Endocrinology | Admitting: Endocrinology

## 2020-11-17 ENCOUNTER — Inpatient Hospital Stay (HOSPITAL_COMMUNITY): Admission: AD | Admit: 2020-11-17 | Payer: Self-pay

## 2020-11-17 DIAGNOSIS — Z1330 Encounter for screening examination for mental health and behavioral disorders, unspecified: Secondary | ICD-10-CM | POA: Insufficient documentation

## 2020-11-18 ENCOUNTER — Encounter (HOSPITAL_COMMUNITY): Payer: Self-pay | Admitting: Emergency Medicine

## 2020-11-18 ENCOUNTER — Other Ambulatory Visit: Payer: Self-pay

## 2020-11-18 ENCOUNTER — Ambulatory Visit (HOSPITAL_COMMUNITY)
Admission: EM | Admit: 2020-11-18 | Discharge: 2020-11-19 | Disposition: A | Discharge status: Home / Self Care | Payer: No Payment, Other | Attending: Student | Admitting: Student

## 2020-11-18 DIAGNOSIS — Z20822 Contact with and (suspected) exposure to covid-19: Secondary | ICD-10-CM | POA: Diagnosis not present

## 2020-11-18 DIAGNOSIS — F332 Major depressive disorder, recurrent severe without psychotic features: Secondary | ICD-10-CM | POA: Diagnosis not present

## 2020-11-18 LAB — POCT URINE DRUG SCREEN - MANUAL ENTRY (I-SCREEN)
POC Amphetamine UR: NOT DETECTED
POC Buprenorphine (BUP): NOT DETECTED
POC Cocaine UR: POSITIVE — AB
POC Marijuana UR: POSITIVE — AB
POC Methadone UR: NOT DETECTED
POC Methamphetamine UR: NOT DETECTED
POC Morphine: NOT DETECTED
POC Oxazepam (BZO): NOT DETECTED
POC Oxycodone UR: NOT DETECTED
POC Secobarbital (BAR): NOT DETECTED

## 2020-11-18 LAB — POC SARS CORONAVIRUS 2 AG -  ED: SARS Coronavirus 2 Ag: NEGATIVE

## 2020-11-18 LAB — POCT PREGNANCY, URINE: Preg Test, Ur: NEGATIVE

## 2020-11-18 LAB — POC SARS CORONAVIRUS 2 AG: SARS Coronavirus 2 Ag: NEGATIVE

## 2020-11-18 MED ORDER — ACETAMINOPHEN 325 MG PO TABS
650.0000 mg | ORAL_TABLET | Freq: Four times a day (QID) | ORAL | Status: DC | PRN
  Administered 2020-11-19: 650 mg via ORAL
  Filled 2020-11-18: qty 2

## 2020-11-18 MED ORDER — ALUM & MAG HYDROXIDE-SIMETH 200-200-20 MG/5ML PO SUSP: 30.0000 mL | ORAL | Status: DC | PRN

## 2020-11-18 MED ORDER — MAGNESIUM HYDROXIDE 400 MG/5ML PO SUSP: 30.0000 mL | Freq: Every day | ORAL | Status: DC | PRN

## 2020-11-18 MED ORDER — HYDROXYZINE HCL 25 MG PO TABS: 25.0000 mg | ORAL_TABLET | Freq: Three times a day (TID) | ORAL | Status: DC | PRN

## 2020-11-18 MED ORDER — TRAZODONE HCL 50 MG PO TABS: 50.0000 mg | ORAL_TABLET | Freq: Every evening | ORAL | Status: DC | PRN

## 2020-11-18 NOTE — [facility] Counselor (Signed)
TTS triage: Patient presents to [facility] reporting "depression and anxiety." She endorses current suicidal ideation without a specific plan but reports a history of cutting. Patient pulls up sleeve and shows multiple scars to forearms. She states she currently lives with her mother in [place]. Patient denies HI/AVH.

## 2020-11-18 NOTE — ED Notes (Addendum)
Pt A&O x 4, calm & cooperative, returns with complaint of suicidal ideations  X 2-3 weeks with depression and anxiety.  Pt is a cutter, healed cut marks noted to bilateral arms & legs.  Denies HI or AVH.  Skin search completed, monitoring for safety.

## 2020-11-18 NOTE — ED Provider Notes (Addendum)
Behavioral Health Admission H&P [facility] & OBS)  Date: 11/18/20 Patient Name: Susan Graves MRN: [id number] Chief Complaint:  Chief Complaint  Patient presents with  . Depression  . Anxiety   Chief Complaint/Presenting Problem: Pt reports symptoms of depression and anxiety. States she is having thoughts of cutting her arm. She reports current suicidal ideation with no specific plan.  Diagnoses:  Final diagnoses:  Severe episode of recurrent major depressive disorder, without psychotic features (HCC)    HPI: Susan Graves is a 25 year old female who presents to the behavioral health urgent care voluntarily for depression, anxiety, and SI.  Per chart review, patient presented to [facility] as a walk-in on November 18, 2020 and did not meet inpatient psychiatric treatment for [facility] continuous observation criteria at that time.  Patient was discharged from [facility] with resources at that time.  Please see Nira Conn, NP 11/17/20 note for further details regarding this encounter.  Patient assessed by myself upon her arrival to the [facility] on November 18, 2020.  Patient states that she has been experiencing increased depression and anxiety recently.  Patient also endorses active SI with no associated plan.  She denies HI, AVH, or delusions.  She endorses 1 past suicide attempt in which she attempted to overdose on Seroquel 1.5 years ago but denies any additional suicide attempts.  Patient also states that she has been cutting her left arm, both legs, both thighs for quite some time and states that she last cut herself "a couple of months ago", in which she cut her left arm.  Patient denies any history of burning herself.  Patient states that her sleep is fair, sleeping about 6 hours per night.  She endorses anhedonia, feelings of guilt, hopelessness, and worthlessness.  She endorses decreased energy, decreased concentration, and decreased appetite, but is unsure about recent weight changes.  She states that she has never  seen a psychiatrist or therapist before and that she received inpatient psychiatric treatment a couple of days ago in [facility].  When asked about where or why she received inpatient psychiatric treatment, patient is unable to provide further information about this.  Patient denies drinking alcohol, using tobacco products, vaping, or using any additional substances.  She states that she lives in [place] with her mother and she denies any access to guns or weapons at home.  She states that she is unemployed and not in school.  She states that her main source of support is her mother.  Patient states that she is not taking any medications at this time.  She denies any family history of mental health issues, substance use, or suicide.  PHQ 2-9:  Flowsheet Row ED from 11/18/2020 in [facility]  Thoughts that you would be better off dead, or of hurting yourself in some way Nearly every day  PHQ-9 Total Score 27      Flowsheet Row ED from 11/18/2020 in [facility]  C-SSRS RISK CATEGORY High Risk       Total Time spent with patient: 30 minutes  Musculoskeletal  Strength & Muscle Tone: within normal limits Gait & Station: normal Patient leans: N/A  Psychiatric Specialty Exam  Presentation General Appearance: Well Groomed  Eye Contact:Fleeting  Speech:Clear and Coherent; Normal Rate  Speech Volume:Normal  Handedness:No data recorded  Mood and Affect  Mood:Anxious; Depressed  Affect:Congruent   Thought Process  Thought Processes:Coherent; Goal Directed  Descriptions of Associations:Intact  Orientation:Full (Time, Place and Person)  Thought Content:WDL  Hallucinations:Hallucinations: None  Ideas of Reference:None  Suicidal Thoughts:Suicidal Thoughts: Yes, Active SI Active Intent and/or Plan: With Intent; Without Plan; Without Access to Means; Without Means to Carry Out  Homicidal Thoughts:Homicidal  Thoughts: No   Sensorium  Memory:Immediate Fair; Recent Fair; Remote Fair  Judgment:Fair  Insight:Fair   Executive Functions  Concentration:Fair  Attention Span:Fair  Recall:Fair  Fund of Knowledge:Fair  Language:Fair   Psychomotor Activity  Psychomotor Activity:Psychomotor Activity: Normal   Assets  Assets:Communication Skills; Desire for Improvement; Financial Resources/Insurance; Housing; Leisure Time; Physical Health; Social Support   Sleep  Sleep:Sleep: Fair Number of Hours of Sleep: 6   Physical Exam Vitals reviewed.  Constitutional:      Comments: Patient appears tired.   HENT:     Head: Normocephalic and atraumatic.     Right Ear: External ear normal.     Left Ear: External ear normal.  Cardiovascular:     Rate and Rhythm: Tachycardia present.  Pulmonary:     Effort: Pulmonary effort is normal. No respiratory distress.  Musculoskeletal:        General: Normal range of motion.     Cervical back: Normal range of motion.  Skin:    Comments: Multiple cut marks/lacerations with associated scarring and various stages of healing, but no signs of infection noted on patient's left arm  Neurological:     Mental Status: She is alert and oriented to person, place, and time.  Psychiatric:        Attention and Perception: Attention normal. She does not perceive auditory or visual hallucinations.        Mood and Affect: Mood is anxious and depressed.        Speech: Speech normal.        Behavior: Behavior is not agitated, slowed, aggressive, withdrawn, hyperactive or combative. Behavior is cooperative.        Thought Content: Thought content is not paranoid or delusional. Thought content includes suicidal ideation. Thought content does not include homicidal ideation. Thought content does not include suicidal plan.     Comments: Mood his affect congruent.  Judgment fair and insight fair.    Review of Systems  Constitutional: Negative for chills, diaphoresis,  fever, malaise/fatigue and weight loss.  HENT: Negative for congestion.   Respiratory: Negative for cough and shortness of breath.   Cardiovascular: Negative for chest pain and palpitations.  Gastrointestinal: Negative for abdominal pain, constipation, diarrhea, nausea and vomiting.  Musculoskeletal: Negative for joint pain and myalgias.  Neurological: Negative for dizziness and headaches.  Psychiatric/Behavioral: Positive for depression and suicidal ideas. Negative for hallucinations, memory loss and substance abuse. The patient is nervous/anxious. The patient does not have insomnia.   All other systems reviewed and are negative.   Vitals: Blood pressure (!) 107/54, pulse (!) 107, temperature 98 F (36.7 C), temperature source Temporal, resp. rate 18, height 5\' 6"  (1.676 m), weight 157 lb (71.2 kg), SpO2 98 %. Body mass index is 25.34 kg/m.  Past Psychiatric History: Depression, Anxiety (Per patient)  Is the patient at risk to self? Yes  Has the patient been a risk to self in the past 6 months? Yes .    Has the patient been a risk to self within the distant past? Yes   Is the patient a risk to others? No   Has the patient been a risk to others in the past 6 months? No   Has the patient been a risk to others within the distant past? No   Past  Medical History:  Past Medical History:  Diagnosis Date  . ADHD     Past Surgical History:  Procedure Laterality Date  . DEBRIDEMENT AND CLOSURE WOUND Left 03/31/2020   Procedure: DEBRIDEMENT AND CLOSURE WOUND;  Surgeon: Roby Lofts, MD;  Location: [facility];  Service: Orthopedics;  Laterality: Left;  . ORIF PELVIC FRACTURE WITH PERCUTANEOUS SCREWS Left 03/31/2020   Procedure: ORIF PELVIC FRACTURE WITH PERCUTANEOUS SCREWS;  Surgeon: Roby Lofts, MD;  Location: [facility];  Service: Orthopedics;  Laterality: Left;  . TIBIA IM NAIL INSERTION Right 03/31/2020   Procedure: INTRAMEDULLARY (IM) NAIL TIBIAL;  Surgeon: Roby Lofts, MD;  Location: [facility]  OR;  Service: Orthopedics;  Laterality: Right;    Family History: History reviewed. No pertinent family history.  Social History:  Social History   Socioeconomic History  . Marital status: Single    Spouse name: Not on file  . Number of children: Not on file  . Years of education: Not on file  . Highest education level: Not on file  Occupational History  . Not on file  Tobacco Use  . Smoking status: Current Every Day Smoker    Packs/day: 1.00    Years: 13.00    Pack years: 13.00    Types: Cigarettes    Start date: 03/15/2008  . Smokeless tobacco: Current User  Vaping Use  . Vaping Use: Never used  Substance and Sexual Activity  . Alcohol use: No  . Drug use: Never  . Sexual activity: Not on file  Other Topics Concern  . Not on file  Social History Narrative   ** Merged History Encounter **       Social Determinants of Health   Financial Resource Strain: Not on file  Food Insecurity: Not on file  Transportation Needs: Not on file  Physical Activity: Not on file  Stress: Not on file  Social Connections: Not on file  Intimate Partner Violence: Not on file    SDOH:  SDOH Screenings   Alcohol Screen: Not on file  Depression (PHQ2-9): Medium Risk  . PHQ-2 Score: 27  Financial Resource Strain: Not on file  Food Insecurity: Not on file  Housing: Not on file  Physical Activity: Not on file  Social Connections: Not on file  Stress: Not on file  Tobacco Use: High Risk  . Smoking Tobacco Use: Current Every Day Smoker  . Smokeless Tobacco Use: Current User  Transportation Needs: Not on file    Last Labs:  No visits with results within 6 Month(s) from this visit.  Latest known visit with results is:  Admission on 04/12/2020, Discharged on 04/20/2020  Component Date Value Ref Range Status  . WBC 04/13/2020 6.1  4.0 - 10.5 K/uL Final  . RBC 04/13/2020 3.31* 3.87 - 5.11 MIL/uL Final  . Hemoglobin 04/13/2020 8.7* 12.0 - 15.0 g/dL Final  . HCT 57/32/2025 28.8* 36.0  - 46.0 % Final  . MCV 04/13/2020 87.0  80.0 - 100.0 fL Final  . MCH 04/13/2020 26.3  26.0 - 34.0 pg Final  . MCHC 04/13/2020 30.2  30.0 - 36.0 g/dL Final  . RDW 42/70/6237 18.3* 11.5 - 15.5 % Final  . Platelets 04/13/2020 852* 150 - 400 K/uL Final  . nRBC 04/13/2020 0.0  0.0 - 0.2 % Final  . Neutrophils Relative % 04/13/2020 55  % Final  . Neutro Abs 04/13/2020 3.4  1.7 - 7.7 K/uL Final  . Lymphocytes Relative 04/13/2020 29  % Final  . Lymphs Abs  04/13/2020 1.8  0.7 - 4.0 K/uL Final  . Monocytes Relative 04/13/2020 10  % Final  . Monocytes Absolute 04/13/2020 0.6  0.1 - 1.0 K/uL Final  . Eosinophils Relative 04/13/2020 3  % Final  . Eosinophils Absolute 04/13/2020 0.2  0.0 - 0.5 K/uL Final  . Basophils Relative 04/13/2020 1  % Final  . Basophils Absolute 04/13/2020 0.1  0.0 - 0.1 K/uL Final  . Immature Granulocytes 04/13/2020 2  % Final  . Abs Immature Granulocytes 04/13/2020 0.09* 0.00 - 0.07 K/uL Final   Performed at [facility], [address] [place], [place] [address]  . Sodium 04/13/2020 140  135 - 145 mmol/L Final  . Potassium 04/13/2020 4.5  3.5 - 5.1 mmol/L Final  . Chloride 04/13/2020 103  98 - 111 mmol/L Final  . CO2 04/13/2020 28  22 - 32 mmol/L Final  . Glucose, Bld 04/13/2020 108* 70 - 99 mg/dL Final   Glucose reference range applies only to samples taken after fasting for at least 8 hours.  . BUN 04/13/2020 16  6 - 20 mg/dL Final  . Creatinine, Ser 04/13/2020 0.74  0.44 - 1.00 mg/dL Final  . Calcium 81/19/147806/30/2021 9.4  8.9 - 10.3 mg/dL Final  . Total Protein 04/13/2020 6.8  6.5 - 8.1 g/dL Final  . Albumin 29/56/213006/30/2021 3.4* 3.5 - 5.0 g/dL Final  . AST 86/57/846906/30/2021 38  15 - 41 U/L Final  . ALT 04/13/2020 67* 0 - 44 U/L Final  . Alkaline Phosphatase 04/13/2020 93  38 - 126 U/L Final  . Total Bilirubin 04/13/2020 0.4  0.3 - 1.2 mg/dL Final  . GFR calc non Af Amer 04/13/2020 >60  >60 mL/min Final  . GFR calc Af Amer 04/13/2020 >60  >60 mL/min Final  . Anion gap  04/13/2020 9  5 - 15 Final   Performed at [facility], [address] [place], [place] [address]  . Specimen Description 04/18/2020 URINE, CLEAN CATCH   Final  . Special Requests 04/18/2020 NONE   Final  . Culture 04/18/2020    Final                   Value:NO GROWTH Performed at [facility], [address] [place], [place] [address]   . Report Status 04/18/2020 04/19/2020 FINAL   Final  . Neisseria Gonorrhea 04/18/2020 Negative   Final  . Chlamydia 04/18/2020 Negative   Final  . Comment 04/18/2020 Normal Reference Ranger Chlamydia - Negative   Final  . Comment 04/18/2020 Normal Reference Range Neisseria Gonorrhea - Negative   Final    Allergies: Patient has no known allergies.  PTA Medications: (Not in a hospital admission)   Medical Decision Making  Patient is a 25 year old female with a reported history of depression and anxiety who presents to the behavioral health urgent care as a voluntary walk-in for depression, anxiety, and SI after [facility] for similar presentation.  Based on patient's multiple visits to [facility] within 24 hours, her endorsement of SI, and past reported suicide attempt, recommend continuous observation for the patient at this time.    Recommendations  Based on my evaluation the patient does not appear to have an emergency medical condition. Will place the patient in behavioral health urgent care continuous observation/assessment for further stabilization and treatment.  Patient will be reevaluated on November 19, 2020 by the treatment team and disposition to be determined at that time. Labs ordered  and reviewed:  -UDS: Positive for cocaine and marijuana  -Urine pregnancy: Negative  -CBC with differential: Hemoglobin slightly reduced at 11.3 g/dL, hematocrit slightly reduced at 34.7%, otherwise unremarkable  -CMP: Glucose slightly elevated at 122 mg/dL, total protein slightly reduced at 5.8 g/dL, otherwise  unremarkable Patient not taking any home medications.  Will order Vistaril 25 mg p.o. 3 times daily as needed for anxiety and trazodone 50 mg p.o. at bedtime as needed for sleep.  Jaclyn Shaggy, PA-C 11/18/20  9:30 PM
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
## 2020-11-18 NOTE — ED Triage Notes (Signed)
Presents with Depression and anxiety x 4 weeks.  Denies  SI, HI or AVH.

## 2020-11-18 NOTE — ED Notes (Signed)
Patient belonginbgs are stored in locker #[id number]

## 2020-11-18 NOTE — ED Notes (Signed)
Pt asleep in bed. Respirations even and unlabored. Will continue to monitor for safety. ?
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
## 2020-11-18 NOTE — H&P (Signed)
Behavioral Health Medical Screening Exam  Susan Graves is an 25 y.o. female who presented to [facility] voluntarily with a friend who also presented for an evaluation. On evaluation, patient is drowsy and requires frequent verbal cues to respond to questions. Patient reports that she has been feeling depressed for 2 months. She reports passive suicidal thoughts without intent or plan. She reports occassional use of cocaine, last use was this afternoon. Denies use of alcohol and other substances. She denies homicidal ideations. She denies auditory and visual hallucinations. She does not appear to be responding to internal stimuli.   Total Time spent with patient: 15 minutes  Psychiatric Specialty Exam: Physical Exam Vitals and nursing note reviewed.  Constitutional:      General: She is not in acute distress.    Appearance: She is not ill-appearing, toxic-appearing or diaphoretic.  HENT:     Head: Normocephalic.     Right Ear: External ear normal.     Left Ear: External ear normal.  Cardiovascular:     Rate and Rhythm: Normal rate.  Pulmonary:     Effort: Pulmonary effort is normal. No respiratory distress.  Musculoskeletal:        General: Normal range of motion.  Neurological:     General: No focal deficit present.     Mental Status: She is alert and oriented to person, place, and time.  Psychiatric:        Mood and Affect: Mood is depressed.        Behavior: Behavior is cooperative.        Thought Content: Thought content is not paranoid or delusional. Thought content includes suicidal ideation. Thought content does not include homicidal ideation.    Review of Systems  Constitutional: Negative for activity change, appetite change, chills, diaphoresis, fatigue, fever and unexpected weight change.  Respiratory: Negative for cough and shortness of breath.   Cardiovascular: Negative for chest pain.  Gastrointestinal: Negative for diarrhea, nausea and vomiting.  Neurological: Negative  for dizziness and seizures.  Psychiatric/Behavioral: Positive for dysphoric mood and suicidal ideas. Negative for hallucinations. The patient is nervous/anxious.    Blood pressure 95/83, pulse 91, temperature 97.9 F (36.6 C), temperature source Oral.There is no height or weight on file to calculate BMI. General Appearance: Casual and Neat Eye Contact:  Minimal Speech:  Clear and Coherent and Normal Rate Volume:  Decreased Mood:  Anxious and Depressed Affect:  Congruent Thought Process:  Coherent and Linear Orientation:  Full (Time, Place, and Person) Thought Content:  Logical Suicidal Thoughts:  Passive SI without intent or plan Homicidal Thoughts:  No Memory:  Immediate;   Good Recent;   Good Judgement:  Fair Insight:  Fair Psychomotor Activity:  Normal Recall:  Good Fund of Knowledge:Good Language: Good Akathisia:  Negative  AIMS (if indicated):    Assets:  Communication Skills Desire for Improvement Physical Health Sleep:     Musculoskeletal: Strength & Muscle Tone: within normal limits Gait & Station: normal Patient leans: N/A  Blood pressure 95/83, pulse 91, temperature 97.9 F (36.6 C), temperature source Oral.  Recommendations: Based on my evaluation the patient does not appear to have an emergency medical condition.   Disposition: No evidence of imminent risk to self or others at present.   Patient does not meet criteria for psychiatric inpatient admission. Supportive therapy provided about ongoing stressors. Discussed crisis plan, support from social network, calling 911, coming to the Emergency Department, and calling Suicide Hotline.  Jackelyn Poling, NP 11/18/2020, 1:11 AM

## 2020-11-19 LAB — COMPREHENSIVE METABOLIC PANEL
ALT: 29 U/L (ref 0–44)
AST: 21 U/L (ref 15–41)
Albumin: 3.5 g/dL (ref 3.5–5.0)
Alkaline Phosphatase: 79 U/L (ref 38–126)
Anion gap: 10 (ref 5–15)
BUN: 9 mg/dL (ref 6–20)
CO2: 22 mmol/L (ref 22–32)
Calcium: 9 mg/dL (ref 8.9–10.3)
Chloride: 111 mmol/L (ref 98–111)
Creatinine, Ser: 0.69 mg/dL (ref 0.44–1.00)
GFR, Estimated: >60 mL/min (ref >60)
Glucose, Bld: 122 mg/dL — ABNORMAL HIGH (ref 70–99)
Potassium: 4.2 mmol/L (ref 3.5–5.1)
Sodium: 143 mmol/L (ref 135–145)
Total Bilirubin: 0.3 mg/dL (ref 0.3–1.2)
Total Protein: 5.8 g/dL — ABNORMAL LOW (ref 6.5–8.1)

## 2020-11-19 LAB — CBC WITH DIFFERENTIAL/PLATELET
Abs Immature Granulocytes: 0.01 10*3/uL (ref 0.00–0.07)
Basophils Absolute: 0 10*3/uL (ref 0.0–0.1)
Basophils Relative: 1 %
Eosinophils Absolute: 0.1 10*3/uL (ref 0.0–0.5)
Eosinophils Relative: 2 %
HCT: 34.7 % — ABNORMAL LOW (ref 36.0–46.0)
Hemoglobin: 11.3 g/dL — ABNORMAL LOW (ref 12.0–15.0)
Immature Granulocytes: 0 %
Lymphocytes Relative: 47 %
Lymphs Abs: 2.7 10*3/uL (ref 0.7–4.0)
MCH: 28.1 pg (ref 26.0–34.0)
MCHC: 32.6 g/dL (ref 30.0–36.0)
MCV: 86.3 fL (ref 80.0–100.0)
Monocytes Absolute: 0.4 10*3/uL (ref 0.1–1.0)
Monocytes Relative: 8 %
Neutro Abs: 2.4 10*3/uL (ref 1.7–7.7)
Neutrophils Relative %: 42 %
Platelets: 325 10*3/uL (ref 150–400)
RBC: 4.02 MIL/uL (ref 3.87–5.11)
RDW: 13.6 % (ref 11.5–15.5)
WBC: 5.7 10*3/uL (ref 4.0–10.5)
nRBC: 0 % (ref 0.0–0.2)

## 2020-11-19 LAB — RESP PANEL BY RT-PCR (FLU A&B, COVID) ARPGX2
Influenza A by PCR: NEGATIVE
Influenza B by PCR: NEGATIVE
SARS Coronavirus 2 by RT PCR: NEGATIVE

## 2020-11-19 NOTE — ED Notes (Signed)
Given lunch

## 2020-11-19 NOTE — ED Notes (Signed)
Patient denies SI/HI. Patient discharge home. Patient escorted off unit by staff.

## 2020-11-19 NOTE — ED Notes (Signed)
Patient is resting in bed with eyes closed. Respirations even and non labored. No distress noted. Monitoring continues. 

## 2020-11-19 NOTE — ED Notes (Signed)
Patient is on unit talking on the phone.No distress noted. Monitoring continues.

## 2020-11-19 NOTE — ED Notes (Signed)
Pt sleeping at present, no distress noted, monitoring for safety. 

## 2020-11-19 NOTE — ED Notes (Signed)
Pt requesting pain medication for "stomach cramps." PRN Tylenol given.

## 2020-11-19 NOTE — Discharge Instructions (Signed)
Take all medications as prescribed. Keep all follow-up appointments as scheduled.  Do not consume alcohol or use illegal drugs while on prescription medications. Report any adverse effects from your medications to your primary care provider promptly.  In the event of recurrent symptoms or worsening symptoms, call 911, a crisis hotline, or go to the nearest emergency department for evaluation.   

## 2020-11-19 NOTE — ED Notes (Signed)
Given  breakfast

## 2020-11-19 NOTE — ED Provider Notes (Signed)
FBC/OBS ASAP Discharge Summary  Date and Time: 11/19/2020 2:39 PM  Name: Susan Graves  MRN:  [id number]   Discharge Diagnoses:  Final diagnoses:  Severe episode of recurrent major depressive disorder, without psychotic features [facility])    Evaluation: Susan Graves presents for overnight observation due to passive suicidal ideations. Patient is requesting to be discharged. " I  need to leave before it gets too late."  during this assessment patient is denying suicidal or homicidal ideations.  Denies auditory or visual hallucinations. This admission appears to be due to malingering behavior as was reported patient is currently homeless.  Patient was admitted with friend citing similar symptoms. [name] is requesting to be discharged due to disposition of another patient.  Patient was provided with homeless shelter resources ie [facility] and additional outpatient follow-up appointment. Support,  Encouragement and  reassurance was provided.  Per admission assessment note: Susan Graves is a 25 year old female who presents to the behavioral health urgent care voluntarily for depression, anxiety, and SI.  Per chart review, patient presented to [facility] as a walk-in on November 18, 2020 and did not meet inpatient psychiatric treatment for [facility] continuous observation criteria at that time.  Patient was discharged from [facility] with resources at that time.  Please see Susan Conn, NP 11/17/20 note for further details regarding this encounter  Total Time spent with patient: 15 minutes  Past Psychiatric History:  Past Medical History:  Past Medical History:  Diagnosis Date  . ADHD     Past Surgical History:  Procedure Laterality Date  . DEBRIDEMENT AND CLOSURE WOUND Left 03/31/2020   Procedure: DEBRIDEMENT AND CLOSURE WOUND;  Surgeon: Roby Lofts, MD;  Location: [facility] OR;  Service: Orthopedics;  Laterality: Left;  . ORIF PELVIC FRACTURE WITH PERCUTANEOUS SCREWS Left 03/31/2020   Procedure: ORIF PELVIC  FRACTURE WITH PERCUTANEOUS SCREWS;  Surgeon: Roby Lofts, MD;  Location: [facility] OR;  Service: Orthopedics;  Laterality: Left;  . TIBIA IM NAIL INSERTION Right 03/31/2020   Procedure: INTRAMEDULLARY (IM) NAIL TIBIAL;  Surgeon: Roby Lofts, MD;  Location: [facility] OR;  Service: Orthopedics;  Laterality: Right;   Family History: History reviewed. No pertinent family history. Family Psychiatric History:  Social History:  Social History   Substance and Sexual Activity  Alcohol Use No     Social History   Substance and Sexual Activity  Drug Use Never    Social History   Socioeconomic History  . Marital status: Single    Spouse name: Not on file  . Number of children: Not on file  . Years of education: Not on file  . Highest education level: Not on file  Occupational History  . Not on file  Tobacco Use  . Smoking status: Current Every Day Smoker    Packs/day: 1.00    Years: 13.00    Pack years: 13.00    Types: Cigarettes    Start date: 03/15/2008  . Smokeless tobacco: Current User  Vaping Use  . Vaping Use: Never used  Substance and Sexual Activity  . Alcohol use: No  . Drug use: Never  . Sexual activity: Not on file  Other Topics Concern  . Not on file  Social History Narrative   ** Merged History Encounter **       Social Determinants of Health   Financial Resource Strain: Not on file  Food Insecurity: Not on file  Transportation Needs: Not on file  Physical Activity: Not on file  Stress: Not on file  Social  Connections: Not on file   SDOH:  SDOH Screenings   Alcohol Screen: Not on file  Depression (PHQ2-9): Medium Risk  . PHQ-2 Score: 27  Financial Resource Strain: Not on file  Food Insecurity: Not on file  Housing: Not on file  Physical Activity: Not on file  Social Connections: Not on file  Stress: Not on file  Tobacco Use: High Risk  . Smoking Tobacco Use: Current Every Day Smoker  . Smokeless Tobacco Use: Current User  Transportation Needs: Not on  file    Has this patient used any form of tobacco in the last 30 days? (Cigarettes, Smokeless Tobacco, Cigars, and/or Pipes) Prescription not provided because: none tabacco user  Current Medications:  Current Facility-Administered Medications  Medication Dose Route Frequency Provider Last Rate Last Admin  . acetaminophen (TYLENOL) tablet 650 mg  650 mg Oral Q6H PRN Jaclyn Shaggy, PA-C   650 mg at 11/19/20 0246  . alum & mag hydroxide-simeth (MAALOX/MYLANTA) 200-200-20 MG/5ML suspension 30 mL  30 mL Oral Q4H PRN Melbourne Abts W, PA-C      . hydrOXYzine (ATARAX/VISTARIL) tablet 25 mg  25 mg Oral TID PRN Jaclyn Shaggy, PA-C      . magnesium hydroxide (MILK OF MAGNESIA) suspension 30 mL  30 mL Oral Daily PRN Melbourne Abts W, PA-C      . traZODone (DESYREL) tablet 50 mg  50 mg Oral QHS PRN Jaclyn Shaggy, PA-C       Current Outpatient Medications  Medication Sig Dispense Refill  . ibuprofen (ADVIL) 400 MG tablet Take 400 mg by mouth every 6 (six) hours as needed for mild pain or headache.    Marland Kitchen acetaminophen (TYLENOL) 325 MG tablet Take 2 tablets (650 mg total) by mouth every 6 (six) hours as needed for mild pain (or Fever >/= 101).    Marland Kitchen apixaban (ELIQUIS) 5 MG TABS tablet Take 1 tablet (5 mg total) by mouth 2 (two) times daily. (Patient not taking: Reported on 11/19/2020) 60 tablet 0  . cephALEXin (KEFLEX) 250 MG capsule Take 1 capsule (250 mg total) by mouth 4 (four) times daily. (Patient not taking: Reported on 11/19/2020) 28 capsule 0  . cholecalciferol (VITAMIN D) 25 MCG tablet Take 2 tablets (2,000 Units total) by mouth daily. (Patient not taking: Reported on 11/19/2020) 30 tablet 0  . docusate sodium (COLACE) 100 MG capsule Take 1 capsule (100 mg total) by mouth 2 (two) times daily. (Patient not taking: Reported on 11/19/2020) 10 capsule 0  . gabapentin (NEURONTIN) 100 MG capsule Take 2 capsules (200 mg total) by mouth 3 (three) times daily. (Patient not taking: Reported on 11/19/2020) 90 capsule 0  .  methocarbamol (ROBAXIN) 500 MG tablet Take 1 tablet (500 mg total) by mouth every 8 (eight) hours. (Patient not taking: Reported on 11/19/2020) 60 tablet 0  . Oxycodone HCl 10 MG TABS Take 1-1.5 tablets (10-15 mg total) by mouth every 4 (four) hours as needed (10 mg for moderate pain, 15 mg for severe pain). (Patient not taking: Reported on 11/19/2020) 30 tablet 0  . polyethylene glycol (MIRALAX / GLYCOLAX) 17 g packet Take 17 g by mouth daily. (Patient not taking: Reported on 11/19/2020) 14 each 0    PTA Medications: (Not in a hospital admission)   Musculoskeletal  Strength & Muscle Tone: within normal limits Gait & Station: normal Patient leans: N/A  Psychiatric Specialty Exam  Presentation  General Appearance: Well Groomed  Eye Contact:Fleeting  Speech:Clear and Coherent; Normal Rate  Speech Volume:Normal  Handedness:No data recorded  Mood and Affect  Mood:Anxious; Depressed  Affect:Congruent   Thought Process  Thought Processes:Coherent; Goal Directed  Descriptions of Associations:Intact  Orientation:Full (Time, Place and Person)  Thought Content:WDL  Hallucinations:Hallucinations: None  Ideas of Reference:None  Suicidal Thoughts:Suicidal Thoughts: Yes, Active SI Active Intent and/or Plan: With Intent; Without Plan; Without Access to Means; Without Means to Carry Out  Homicidal Thoughts:Homicidal Thoughts: No   Sensorium  Memory:Immediate Fair; Recent Fair; Remote Fair  Judgment:Fair  Insight:Fair   Executive Functions  Concentration:Fair  Attention Span:Fair  Recall:Fair  Fund of Knowledge:Fair  Language:Fair   Psychomotor Activity  Psychomotor Activity:Psychomotor Activity: Normal   Assets  Assets:Communication Skills; Desire for Improvement; Financial Resources/Insurance; Housing; Leisure Time; Physical Health; Social Support   Sleep  Sleep:Sleep: Fair Number of Hours of Sleep: 6   Physical Exam  Physical Exam Vitals reviewed.   Neurological:     Mental Status: She is alert.  Psychiatric:        Mood and Affect: Mood normal.        Thought Content: Thought content normal.    Review of Systems  Psychiatric/Behavioral: Positive for depression. Negative for suicidal ideas. The patient is not nervous/anxious and does not have insomnia.   All other systems reviewed and are negative.  Blood pressure 124/82, pulse 90, temperature 97.9 F (36.6 C), temperature source Oral, resp. rate 18, height 5\' 6"  (1.676 m), weight 157 lb (71.2 kg), SpO2 100 %. Body mass index is 25.34 kg/m.  Demographic Factors:  Low socioeconomic status  Loss Factors: Financial problems/change in socioeconomic status  Historical Factors: Family history of mental illness or substance abuse  Risk Reduction Factors:   Positive social support and Positive therapeutic relationship  Continued Clinical Symptoms:  Depression:   Anhedonia  Cognitive Features That Contribute To Risk:  Closed-mindedness    Suicide Risk:  Minimal: No identifiable suicidal ideation.  Patients presenting with no risk factors but with morbid ruminations; may be classified as minimal risk based on the severity of the depressive symptoms  Plan Of Care/Follow-up recommendations:  Activity:  as tolorated Diet:  heart healthy  Disposition: Take all medications as prescribed. Keep all follow-up appointments as scheduled.  Do not consume alcohol or use illegal drugs while on prescription medications. Report any adverse effects from your medications to your primary care provider promptly.  In the event of recurrent symptoms or worsening symptoms, call 911, a crisis hotline, or go to the nearest emergency department for evaluation.   , NP 11/19/2020, 2:39 PM
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
## 2021-05-22 ENCOUNTER — Encounter (HOSPITAL_BASED_OUTPATIENT_CLINIC_OR_DEPARTMENT_OTHER): Payer: Self-pay

## 2021-05-22 ENCOUNTER — Emergency Department (HOSPITAL_BASED_OUTPATIENT_CLINIC_OR_DEPARTMENT_OTHER)
Admission: EM | Admit: 2021-05-22 | Discharge: 2021-05-22 | Disposition: A | Discharge status: Home / Self Care | Payer: Medicaid Other | Attending: Emergency Medicine | Admitting: Emergency Medicine

## 2021-05-22 DIAGNOSIS — Z7901 Long term (current) use of anticoagulants: Secondary | ICD-10-CM | POA: Insufficient documentation

## 2021-05-22 DIAGNOSIS — K648 Other hemorrhoids: Secondary | ICD-10-CM | POA: Insufficient documentation

## 2021-05-22 DIAGNOSIS — F17210 Nicotine dependence, cigarettes, uncomplicated: Secondary | ICD-10-CM | POA: Insufficient documentation

## 2021-05-22 MED ORDER — HYDROCORTISONE ACETATE 25 MG RE SUPP
25.0000 mg | Freq: Two times a day (BID) | RECTAL | 0 refills | Status: DC
Start: 2021-05-22 — End: 2023-09-14

## 2021-05-22 MED ORDER — HYDROCORTISONE ACETATE 25 MG RE SUPP
25.0000 mg | Freq: Two times a day (BID) | RECTAL | 0 refills | Status: DC
Start: 2021-05-22 — End: 2021-05-22

## 2021-05-22 NOTE — ED Notes (Signed)
Pt had friend in room with her, upon d/c from room pt was found to have belongings bag full of tape,  Tissues, coban, dressings and  2x2, and 4x4.  All was removed from belonging bag and returned to room, pt got to keep bag ,  They also had gateraide, sweatshirt and wallet in bag. Which was returned to  Pt and friend, pt then escorted out to d/c
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
## 2021-05-22 NOTE — Discharge Instructions (Addendum)
You most likely have internal hemorrhoids today.  You were given a suppository that you can use at will hopefully help decrease the bleeding but also soak in a warm bathtub and start taking a stool softener like Dulcolax or Colace to have more frequent, smaller bowel movements.  However given your family history with your dad it would be important to follow-up with gastroenterology because this has been ongoing for some time now.

## 2021-05-22 NOTE — ED Triage Notes (Signed)
"  Red blood noted from rectum with bowel movement today, has been off and on since June" per pt

## 2021-05-22 NOTE — ED Notes (Signed)
States has blood from her rectum x 2 days and  She has had clots and it drips

## 2021-08-27 ENCOUNTER — Emergency Department
Admission: EM | Admit: 2021-08-27 | Discharge: 2021-08-27 | Disposition: A | Discharge status: Home / Self Care | Payer: Medicaid Other | Attending: Emergency Medicine | Admitting: Emergency Medicine

## 2021-08-27 ENCOUNTER — Emergency Department: Payer: Medicaid Other

## 2021-08-27 ENCOUNTER — Other Ambulatory Visit: Payer: Self-pay

## 2021-08-27 DIAGNOSIS — Z7901 Long term (current) use of anticoagulants: Secondary | ICD-10-CM | POA: Insufficient documentation

## 2021-08-27 DIAGNOSIS — F17210 Nicotine dependence, cigarettes, uncomplicated: Secondary | ICD-10-CM | POA: Insufficient documentation

## 2021-08-27 DIAGNOSIS — J40 Bronchitis, not specified as acute or chronic: Secondary | ICD-10-CM | POA: Insufficient documentation
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
## 2021-08-27 NOTE — ED Notes (Signed)
Dc ppw provided. Pt denies vs at dc. Pt denies questions and provided verbal consent for dc. Pt off floor on foot

## 2021-08-27 NOTE — Discharge Instructions (Signed)
Please return to the emergency department if you experience new or worsening symptoms. Treat symptoms as needed with tylenol, fluids, and albuterol.

## 2021-08-27 NOTE — ED Triage Notes (Signed)
Pt states that she is having chest pain when she takes deep breaths and had a fever of 103 that went down- pt states yesterday she was "coughing up black/grey stuff"- pt states she went to the doctor in [facility] yesterday morning and they gave her albuterol and she feels like it is not helping- pt states she has used crack 2 days ago and feels like she has metal in her lungs

## 2021-08-27 NOTE — ED Provider Notes (Signed)
Emergency Medicine Provider Triage Evaluation Note  Susan Graves, a 25 y.o. female  was evaluated in triage.  Pt complains of chest pain which takes deep breath as well as fever noted yesterday.  Patient describes she had symptoms started about 2 days ago, including a black sputum.  She was evaluated at an urgent care jealously with a COVID flu test as well as chest x-ray which were all reported as negative patient discharged with a prescription for albuterol.  She denies any significant benefit time she does admit today, however that she used crack 2 days ago, prior to onset of symptoms.  She denies any nausea, vomiting, diarrhea.  Review of Systems  Positive: Cough, chest discomfort with deep breaths Negative: INVD  Physical Exam  BP 111/73 (BP Location: Left Arm)   Pulse 100   Temp 98.7 F (37.1 C) (Oral)   Resp 18   Ht 5\' 6"  (1.676 m)   Wt 72.6 kg   SpO2 98%   BMI 25.82 kg/m  Gen:   Awake, no distress  NAD Resp:  Normal effort CTA MSK:   Moves extremities without difficulty  Other:  CVS: RRR  Medical Decision Making  Medically screening exam initiated at 6:26 PM.  Appropriate orders placed.  Susan Graves was informed that the remainder of the evaluation will be completed by another provider, this initial triage assessment does not replace that evaluation, and the importance of remaining in the ED until their evaluation is complete.  Patient with ED evaluation of intermittent fevers as well as productive cough for the last 2 days after smoking crack cocaine.   Susan Levan, PA-C 08/27/21 08/29/21    Susan Bandy, MD 08/27/21 [phone]

## 2021-11-10 ENCOUNTER — Ambulatory Visit: Payer: Medicaid Other

## 2021-12-05 ENCOUNTER — Encounter: Payer: Self-pay | Admitting: Family Medicine

## 2021-12-05 ENCOUNTER — Other Ambulatory Visit: Payer: Self-pay

## 2021-12-05 ENCOUNTER — Ambulatory Visit: Payer: Self-pay | Admitting: Family Medicine

## 2021-12-05 DIAGNOSIS — Z113 Encounter for screening for infections with a predominantly sexual mode of transmission: Secondary | ICD-10-CM

## 2021-12-05 DIAGNOSIS — N760 Acute vaginitis: Secondary | ICD-10-CM

## 2021-12-05 DIAGNOSIS — B9689 Other specified bacterial agents as the cause of diseases classified elsewhere: Secondary | ICD-10-CM

## 2021-12-05 LAB — HM HIV SCREENING LAB: HM HIV Screening: NEGATIVE

## 2021-12-05 LAB — HEPATITIS B SURFACE ANTIGEN: Hepatitis B Surface Ag: NONREACTIVE

## 2021-12-05 LAB — HM HEPATITIS C SCREENING LAB: HM Hepatitis Screen: NEGATIVE

## 2021-12-05 LAB — WET PREP FOR TRICH, YEAST, CLUE
Trichomonas Exam: NEGATIVE
Yeast Exam: NEGATIVE

## 2021-12-05 MED ORDER — METRONIDAZOLE 500 MG PO TABS: 500.0000 mg | ORAL_TABLET | Freq: Two times a day (BID) | ORAL | 0 refills | Status: AC

## 2021-12-05 NOTE — Progress Notes (Signed)
Pt here for STD screening.  Wet mount results reviewed and medication dispensed per Provider orders.  Pt declined condoms. Ameilia Rattan M Falisa Lamora, RN  
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
## 2023-09-14 ENCOUNTER — Emergency Department (HOSPITAL_COMMUNITY)
Admission: EM | Admit: 2023-09-14 | Discharge: 2023-09-15 | Disposition: A | Discharge status: Home / Self Care | Payer: Self-pay | Attending: Emergency Medicine | Admitting: Emergency Medicine

## 2023-09-14 ENCOUNTER — Encounter (HOSPITAL_COMMUNITY): Payer: Self-pay

## 2023-09-14 DIAGNOSIS — T1491XA Suicide attempt, initial encounter: Secondary | ICD-10-CM

## 2023-09-14 DIAGNOSIS — R45851 Suicidal ideations: Secondary | ICD-10-CM | POA: Diagnosis not present

## 2023-09-14 DIAGNOSIS — S51812A Laceration without foreign body of left forearm, initial encounter: Secondary | ICD-10-CM | POA: Insufficient documentation

## 2023-09-14 DIAGNOSIS — X788XXA Intentional self-harm by other sharp object, initial encounter: Secondary | ICD-10-CM | POA: Insufficient documentation

## 2023-09-14 DIAGNOSIS — S1191XA Laceration without foreign body of unspecified part of neck, initial encounter: Secondary | ICD-10-CM | POA: Insufficient documentation

## 2023-09-14 HISTORY — DX: Depression, unspecified: F32.A

## 2023-09-14 LAB — RAPID URINE DRUG SCREEN, HOSP PERFORMED
Amphetamines: NOT DETECTED
Barbiturates: NOT DETECTED
Benzodiazepines: NOT DETECTED
Cocaine: NOT DETECTED
Opiates: NOT DETECTED
Tetrahydrocannabinol: POSITIVE — AB

## 2023-09-14 LAB — COMPREHENSIVE METABOLIC PANEL
ALT: 18 U/L (ref 0–44)
AST: 20 U/L (ref 15–41)
Albumin: 4.3 g/dL (ref 3.5–5.0)
Alkaline Phosphatase: 64 U/L (ref 38–126)
Anion gap: 11 (ref 5–15)
BUN: 8 mg/dL (ref 6–20)
CO2: 20 mmol/L — ABNORMAL LOW (ref 22–32)
Calcium: 9.1 mg/dL (ref 8.9–10.3)
Chloride: 107 mmol/L (ref 98–111)
Creatinine, Ser: 0.74 mg/dL (ref 0.44–1.00)
GFR, Estimated: >60 mL/min (ref >60)
Glucose, Bld: 85 mg/dL (ref 70–99)
Potassium: 3.4 mmol/L — ABNORMAL LOW (ref 3.5–5.1)
Sodium: 138 mmol/L (ref 135–145)
Total Bilirubin: 0.4 mg/dL (ref <1.2)
Total Protein: 7.7 g/dL (ref 6.5–8.1)

## 2023-09-14 LAB — CBC WITH DIFFERENTIAL/PLATELET
Abs Immature Granulocytes: 0.02 10*3/uL (ref 0.00–0.07)
Basophils Absolute: 0 10*3/uL (ref 0.0–0.1)
Basophils Relative: 1 %
Eosinophils Absolute: 0 10*3/uL (ref 0.0–0.5)
Eosinophils Relative: 0 %
HCT: 35.7 % — ABNORMAL LOW (ref 36.0–46.0)
Hemoglobin: 10.8 g/dL — ABNORMAL LOW (ref 12.0–15.0)
Immature Granulocytes: 0 %
Lymphocytes Relative: 26 %
Lymphs Abs: 1.6 10*3/uL (ref 0.7–4.0)
MCH: 22.8 pg — ABNORMAL LOW (ref 26.0–34.0)
MCHC: 30.3 g/dL (ref 30.0–36.0)
MCV: 75.3 fL — ABNORMAL LOW (ref 80.0–100.0)
Monocytes Absolute: 0.4 10*3/uL (ref 0.1–1.0)
Monocytes Relative: 6 %
Neutro Abs: 4.1 10*3/uL (ref 1.7–7.7)
Neutrophils Relative %: 67 %
Platelets: 378 10*3/uL (ref 150–400)
RBC: 4.74 MIL/uL (ref 3.87–5.11)
RDW: 17.2 % — ABNORMAL HIGH (ref 11.5–15.5)
WBC: 6.1 10*3/uL (ref 4.0–10.5)
nRBC: 0 % (ref 0.0–0.2)

## 2023-09-14 LAB — ETHANOL: Alcohol, Ethyl (B): 13 mg/dL — ABNORMAL HIGH (ref <10)

## 2023-09-14 LAB — SALICYLATE LEVEL: Salicylate Lvl: <7 mg/dL — ABNORMAL LOW (ref 7.0–30.0)

## 2023-09-14 LAB — HCG, SERUM, QUALITATIVE: Preg, Serum: NEGATIVE

## 2023-09-14 LAB — ACETAMINOPHEN LEVEL: Acetaminophen (Tylenol), Serum: <10 ug/mL — ABNORMAL LOW (ref 10–30)

## 2023-09-14 MED ORDER — ACETAMINOPHEN 500 MG PO TABS
1000.0000 mg | ORAL_TABLET | Freq: Once | ORAL | Status: AC
  Administered 2023-09-14: 1000 mg via ORAL
  Filled 2023-09-14: qty 2

## 2023-09-14 MED ORDER — LIDOCAINE-EPINEPHRINE (PF) 2 %-1:200000 IJ SOLN
INTRAMUSCULAR | Status: AC
  Filled 2023-09-14: qty 20

## 2023-09-14 MED ORDER — LIDOCAINE-EPINEPHRINE (PF) 2 %-1:200000 IJ SOLN
10.0000 mL | Freq: Once | INTRAMUSCULAR | Status: AC
  Administered 2023-09-14: 10 mL via INTRADERMAL
  Filled 2023-09-14: qty 20
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
## 2023-09-14 NOTE — ED Notes (Signed)
This RN put dressing on her wounds, provided comfort.

## 2023-09-14 NOTE — ED Provider Notes (Signed)
[place] EMERGENCY DEPARTMENT AT [facility] Provider Note   CSN: [id number] Arrival date & time: 09/14/23  1740     History  Chief Complaint  Patient presents with   Suicide Attempt   Laceration    Susan Graves is a 27 y.o. female.  27 yo F with a chief complaints of a laceration to the right side of the neck and left arm.  She reportedly had broken up with her girlfriend and then became acutely suicidal and attempted to harm herself.  She is unsure if her tetanus is up-to-date.  Denies other injury.  Denies ingestion.   Laceration      Home Medications Prior to Admission medications   Not on File      Allergies    Patient has no known allergies.    Review of Systems   Review of Systems  Physical Exam Updated Vital Signs BP 116/81 (BP Location: Left Arm)   Pulse (!) 113   Temp 98.6 F (37 C) (Oral)   Resp 16   SpO2 98%  Physical Exam Vitals and nursing note reviewed.  Constitutional:      General: She is not in acute distress.    Appearance: She is well-developed. She is not diaphoretic.  HENT:     Head: Normocephalic and atraumatic.     Mouth/Throat:     Comments: 2 lacerations just through the skin along the right side of the neck.  No significant bleeding.  She has multiple lacerations to the left forearm also just through the skin. Eyes:     Pupils: Pupils are equal, round, and reactive to light.  Cardiovascular:     Rate and Rhythm: Normal rate and regular rhythm.     Heart sounds: No murmur heard.    No friction rub. No gallop.  Pulmonary:     Effort: Pulmonary effort is normal.     Breath sounds: No wheezing or rales.  Abdominal:     General: There is no distension.     Palpations: Abdomen is soft.     Tenderness: There is no abdominal tenderness.  Musculoskeletal:        General: No tenderness.     Cervical back: Normal range of motion and neck supple.  Skin:    General: Skin is warm and dry.  Neurological:     Mental  Status: She is alert and oriented to person, place, and time.  Psychiatric:        Behavior: Behavior normal.     ED Results / Procedures / Treatments   Labs (all labs ordered are listed, but only abnormal results are displayed) Labs Reviewed  COMPREHENSIVE METABOLIC PANEL - Abnormal; Notable for the following components:      Result Value   Potassium 3.4 (*)    CO2 20 (*)    All other components within normal limits  ETHANOL - Abnormal; Notable for the following components:   Alcohol, Ethyl (B) 13 (*)    All other components within normal limits  RAPID URINE DRUG SCREEN, HOSP PERFORMED - Abnormal; Notable for the following components:   Tetrahydrocannabinol POSITIVE (*)    All other components within normal limits  CBC WITH DIFFERENTIAL/PLATELET - Abnormal; Notable for the following components:   Hemoglobin 10.8 (*)    HCT 35.7 (*)    MCV 75.3 (*)    MCH 22.8 (*)    RDW 17.2 (*)    All other components within normal limits  ACETAMINOPHEN LEVEL - Abnormal;  Notable for the following components:   Acetaminophen (Tylenol), Serum <10 (*)    All other components within normal limits  SALICYLATE LEVEL - Abnormal; Notable for the following components:   Salicylate Lvl <7.0 (*)    All other components within normal limits  HCG, SERUM, QUALITATIVE    EKG None  Radiology No results found.  Procedures .Laceration Repair  Date/Time: 09/14/2023 7:31 PM  Performed by: Melene Plan, DO Authorized by: Melene Plan, DO   Consent:    Consent obtained:  Verbal   Consent given by:  Patient   Risks, benefits, and alternatives were discussed: yes     Risks discussed:  Infection, pain, poor cosmetic result and poor wound healing   Alternatives discussed:  No treatment Universal protocol:    Procedure explained and questions answered to patient or proxy's satisfaction: yes     Immediately prior to procedure, a time out was called: yes     Patient identity confirmed:  Verbally with  patient Anesthesia:    Anesthesia method:  Local infiltration   Local anesthetic:  Lidocaine 2% WITH epi Laceration details:    Location:  Neck   Neck location:  R anterior   Length (cm):  15 Pre-procedure details:    Preparation:  Patient was prepped and draped in usual sterile fashion Exploration:    Hemostasis achieved with:  Epinephrine and direct pressure   Wound extent: no foreign body   Treatment:    Area cleansed with:  Chlorhexidine   Amount of cleaning:  Standard   Irrigation solution:  Sterile saline   Irrigation volume:  100   Irrigation method:  Pressure wash   Debridement:  None   Undermining:  None   Scar revision: no   Skin repair:    Repair method:  Sutures   Suture size:  4-0   Suture material:  Nylon   Suture technique:  Simple interrupted   Number of sutures:  9 Approximation:    Approximation:  Close Repair type:    Repair type:  Simple Post-procedure details:    Dressing:  Antibiotic ointment and adhesive bandage   Procedure completion:  Tolerated well, no immediate complications .Laceration Repair  Date/Time: 09/14/2023 7:32 PM  Performed by: Melene Plan, DO Authorized by: Melene Plan, DO   Consent:    Consent obtained:  Verbal   Consent given by:  Patient   Risks, benefits, and alternatives were discussed: yes     Risks discussed:  Infection, pain, poor cosmetic result and poor wound healing   Alternatives discussed:  No treatment Universal protocol:    Procedure explained and questions answered to patient or proxy's satisfaction: yes     Immediately prior to procedure, a time out was called: yes     Patient identity confirmed:  Verbally with patient Anesthesia:    Anesthesia method:  Local infiltration   Local anesthetic:  Lidocaine 2% WITH epi Laceration details:    Location:  Neck   Neck location:  R anterior   Length (cm):  13 Pre-procedure details:    Preparation:  Patient was prepped and draped in usual sterile  fashion Exploration:    Hemostasis achieved with:  Epinephrine and direct pressure   Wound extent: no foreign body and no vascular damage   Treatment:    Area cleansed with:  Chlorhexidine   Amount of cleaning:  Standard   Irrigation solution:  Sterile saline   Irrigation volume:  100   Irrigation method:  Pressure wash  Debridement:  None   Undermining:  None   Scar revision: no   Skin repair:    Repair method:  Sutures   Suture size:  4-0   Suture material:  Nylon   Suture technique:  Simple interrupted   Number of sutures:  7 Approximation:    Approximation:  Close Repair type:    Repair type:  Simple Post-procedure details:    Dressing:  Antibiotic ointment and adhesive bandage   Procedure completion:  Tolerated well, no immediate complications .Laceration Repair  Date/Time: 09/14/2023 7:33 PM  Performed by: Melene Plan, DO Authorized by: Melene Plan, DO   Consent:    Consent obtained:  Verbal   Consent given by:  Patient   Risks, benefits, and alternatives were discussed: yes     Risks discussed:  Infection, pain, poor cosmetic result and poor wound healing   Alternatives discussed:  No treatment Universal protocol:    Procedure explained and questions answered to patient or proxy's satisfaction: yes     Immediately prior to procedure, a time out was called: yes     Patient identity confirmed:  Verbally with patient Laceration details:    Location:  Shoulder/arm   Shoulder/arm location:  L lower arm   Length (cm):  8.2 Pre-procedure details:    Preparation:  Patient was prepped and draped in usual sterile fashion Exploration:    Hemostasis achieved with:  Epinephrine and direct pressure   Wound extent: no foreign body   Treatment:    Area cleansed with:  Chlorhexidine   Amount of cleaning:  Standard   Irrigation solution:  Sterile saline   Irrigation volume:  50   Irrigation method:  Pressure wash   Debridement:  None   Undermining:  None   Scar  revision: no   Skin repair:    Repair method:  Sutures   Suture size:  4-0   Suture material:  Nylon   Suture technique:  Simple interrupted   Number of sutures:  6 Approximation:    Approximation:  Close Repair type:    Repair type:  Simple Post-procedure details:    Dressing:  Antibiotic ointment and adhesive bandage   Procedure completion:  Tolerated well, no immediate complications .Laceration Repair  Date/Time: 09/14/2023 7:34 PM  Performed by: Melene Plan, DO Authorized by: Melene Plan, DO   Consent:    Consent obtained:  Verbal   Consent given by:  Patient   Risks, benefits, and alternatives were discussed: yes     Risks discussed:  Infection, pain, poor cosmetic result and poor wound healing   Alternatives discussed:  No treatment Universal protocol:    Procedure explained and questions answered to patient or proxy's satisfaction: yes     Immediately prior to procedure, a time out was called: yes     Patient identity confirmed:  Verbally with patient Laceration details:    Location:  Shoulder/arm   Shoulder/arm location:  L lower arm   Length (cm):  5.2 Pre-procedure details:    Preparation:  Patient was prepped and draped in usual sterile fashion Exploration:    Hemostasis achieved with:  Epinephrine and direct pressure   Wound exploration: entire depth of wound visualized     Wound extent: no foreign body   Treatment:    Area cleansed with:  Chlorhexidine   Amount of cleaning:  Standard   Irrigation solution:  Sterile saline   Irrigation volume:  50   Irrigation method:  Pressure wash   Debridement:  None   Undermining:  None   Scar revision: no   Skin repair:    Repair method:  Sutures   Suture size:  4-0   Suture material:  Nylon   Suture technique:  Simple interrupted   Number of sutures:  5 Approximation:    Approximation:  Close Repair type:    Repair type:  Simple Post-procedure details:    Dressing:  Antibiotic ointment and adhesive bandage    Procedure completion:  Tolerated well, no immediate complications .Laceration Repair  Date/Time: 09/14/2023 7:34 PM  Performed by: Melene Plan, DO Authorized by: Melene Plan, DO   Consent:    Consent obtained:  Verbal   Consent given by:  Patient   Risks, benefits, and alternatives were discussed: yes     Risks discussed:  Infection, pain, poor cosmetic result and poor wound healing   Alternatives discussed:  No treatment Universal protocol:    Procedure explained and questions answered to patient or proxy's satisfaction: yes     Immediately prior to procedure, a time out was called: yes     Patient identity confirmed:  Verbally with patient Laceration details:    Length (cm):  4.5 Pre-procedure details:    Preparation:  Patient was prepped and draped in usual sterile fashion Exploration:    Hemostasis achieved with:  Epinephrine and direct pressure   Wound extent: no foreign body   Treatment:    Area cleansed with:  Chlorhexidine   Amount of cleaning:  Standard   Irrigation solution:  Sterile saline   Irrigation volume:  50   Irrigation method:  Pressure wash   Debridement:  None   Undermining:  None   Scar revision: no   Skin repair:    Repair method:  Sutures   Suture size:  4-0   Suture material:  Nylon   Suture technique:  Simple interrupted   Number of sutures:  3 Approximation:    Approximation:  Close Repair type:    Repair type:  Simple Post-procedure details:    Dressing:  Antibiotic ointment and adhesive bandage   Procedure completion:  Tolerated well, no immediate complications .Laceration Repair  Date/Time: 09/14/2023 7:37 PM  Performed by: Melene Plan, DO Authorized by: Melene Plan, DO   Consent:    Consent obtained:  Verbal   Consent given by:  Patient   Risks, benefits, and alternatives were discussed: yes     Risks discussed:  Infection, pain, poor cosmetic result and poor wound healing   Alternatives discussed:  No treatment Universal protocol:     Procedure explained and questions answered to patient or proxy's satisfaction: yes     Immediately prior to procedure, a time out was called: yes     Patient identity confirmed:  Verbally with patient Laceration details:    Location:  Shoulder/arm   Shoulder/arm location:  L lower arm   Length (cm):  3.2 Pre-procedure details:    Preparation:  Patient was prepped and draped in usual sterile fashion Exploration:    Hemostasis achieved with:  Epinephrine and direct pressure   Wound extent: no foreign body   Treatment:    Area cleansed with:  Chlorhexidine   Amount of cleaning:  Standard   Irrigation solution:  Sterile saline   Irrigation volume:  50   Irrigation method:  Pressure wash   Debridement:  None   Undermining:  None   Scar revision: no   Skin repair:    Repair method:  Sutures   Suture  size:  4-0   Suture material:  Nylon   Suture technique:  Simple interrupted   Number of sutures:  3 Approximation:    Approximation:  Close Repair type:    Repair type:  Simple Post-procedure details:    Dressing:  Antibiotic ointment and adhesive bandage   Procedure completion:  Tolerated well, no immediate complications     Medications Ordered in ED Medications  lidocaine-EPINEPHrine (XYLOCAINE W/EPI) 2 %-1:200000 (PF) injection 10 mL (10 mLs Intradermal Given 09/14/23 1835)  lidocaine-EPINEPHrine (XYLOCAINE W/EPI) 2 %-1:200000 (PF) injection (  Given 09/14/23 1931)    ED Course/ Medical Decision Making/ A&P                                 Medical Decision Making Amount and/or Complexity of Data Reviewed Labs: ordered.  Risk Prescription drug management.   27 yo F with a chief complaints of attempting to end her life by cutting herself with a straight razor.  This occurred just prior to arrival.  Patient has multiple very superficial lacerations.  These injuries are not life-threatening.  I feel she is medically clear for TTS evaluation.  On my record review the  patient did have her tetanus updated in 2021.  Wounds repaired at bedside.  I would likely have her get the stitches removed to her neck in about 7 days and the ones in her arm sometime between 7 and 10 days.  Patient's labs have resulted, no significant electrolyte abnormalities, no anemia.  Pregnancy test negative.  Tylenol and salicylate levels were negative.  The patients results and plan were reviewed and discussed.   Any x-rays performed were independently reviewed by myself.   Differential diagnosis were considered with the presenting HPI.  Medications  lidocaine-EPINEPHrine (XYLOCAINE W/EPI) 2 %-1:200000 (PF) injection 10 mL (10 mLs Intradermal Given 09/14/23 1835)  lidocaine-EPINEPHrine (XYLOCAINE W/EPI) 2 %-1:200000 (PF) injection (  Given 09/14/23 1931)    Vitals:   09/14/23 1746  BP: 116/81  Pulse: (!) 113  Resp: 16  Temp: 98.6 F (37 C)  TempSrc: Oral  SpO2: 98%    Final diagnoses:  Suicide attempt [facility])          Final Clinical Impression(s) / ED Diagnoses Final diagnoses:  Suicide attempt [facility])    Rx / DC Orders ED Discharge Orders     None         Melene Plan, DO 09/14/23 2040

## 2023-09-14 NOTE — Consult Note (Signed)
Attempted to see this patient but getting to the door realized door closed as she was being being stitched up.  May try later.  [employer] officer tells this provider that mom reported that patient is known for cutting wrist but this is the first time she is reaching her neck.

## 2023-09-14 NOTE — ED Notes (Signed)
Provided still at bedside for suturing
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
## 2023-09-15 ENCOUNTER — Encounter (HOSPITAL_COMMUNITY): Payer: Self-pay | Admitting: Psychiatry

## 2023-09-15 ENCOUNTER — Inpatient Hospital Stay (HOSPITAL_COMMUNITY)
Admission: AD | Admit: 2023-09-15 | Discharge: 2023-09-22 | DRG: 885 | Disposition: A | Discharge status: Home / Self Care | Payer: No Typology Code available for payment source | Source: Intra-hospital | Attending: Psychiatry | Admitting: Psychiatry

## 2023-09-15 ENCOUNTER — Other Ambulatory Visit: Payer: Self-pay

## 2023-09-15 DIAGNOSIS — Z833 Family history of diabetes mellitus: Secondary | ICD-10-CM | POA: Diagnosis not present

## 2023-09-15 DIAGNOSIS — Z5900 Homelessness unspecified: Secondary | ICD-10-CM | POA: Diagnosis not present

## 2023-09-15 DIAGNOSIS — X788XXA Intentional self-harm by other sharp object, initial encounter: Secondary | ICD-10-CM | POA: Diagnosis present

## 2023-09-15 DIAGNOSIS — F909 Attention-deficit hyperactivity disorder, unspecified type: Secondary | ICD-10-CM | POA: Diagnosis present

## 2023-09-15 DIAGNOSIS — Z9151 Personal history of suicidal behavior: Secondary | ICD-10-CM

## 2023-09-15 DIAGNOSIS — F332 Major depressive disorder, recurrent severe without psychotic features: Principal | ICD-10-CM | POA: Diagnosis present

## 2023-09-15 DIAGNOSIS — F17290 Nicotine dependence, other tobacco product, uncomplicated: Secondary | ICD-10-CM | POA: Diagnosis present

## 2023-09-15 DIAGNOSIS — F1220 Cannabis dependence, uncomplicated: Secondary | ICD-10-CM | POA: Diagnosis present

## 2023-09-15 DIAGNOSIS — S59812A Other specified injuries left forearm, initial encounter: Secondary | ICD-10-CM | POA: Diagnosis present

## 2023-09-15 DIAGNOSIS — Z5941 Food insecurity: Secondary | ICD-10-CM | POA: Diagnosis not present

## 2023-09-15 DIAGNOSIS — F17200 Nicotine dependence, unspecified, uncomplicated: Secondary | ICD-10-CM | POA: Diagnosis present

## 2023-09-15 DIAGNOSIS — Z818 Family history of other mental and behavioral disorders: Secondary | ICD-10-CM | POA: Diagnosis not present

## 2023-09-15 DIAGNOSIS — F411 Generalized anxiety disorder: Secondary | ICD-10-CM | POA: Diagnosis present

## 2023-09-15 DIAGNOSIS — R4588 Nonsuicidal self-harm: Secondary | ICD-10-CM | POA: Diagnosis present

## 2023-09-15 DIAGNOSIS — Z91410 Personal history of adult physical and sexual abuse: Secondary | ICD-10-CM | POA: Diagnosis not present

## 2023-09-15 DIAGNOSIS — F1520 Other stimulant dependence, uncomplicated: Secondary | ICD-10-CM | POA: Diagnosis present

## 2023-09-15 DIAGNOSIS — Z91411 Personal history of adult psychological abuse: Secondary | ICD-10-CM | POA: Diagnosis not present

## 2023-09-15 DIAGNOSIS — F17210 Nicotine dependence, cigarettes, uncomplicated: Secondary | ICD-10-CM | POA: Diagnosis present

## 2023-09-15 DIAGNOSIS — Y929 Unspecified place or not applicable: Secondary | ICD-10-CM

## 2023-09-15 DIAGNOSIS — Z560 Unemployment, unspecified: Secondary | ICD-10-CM | POA: Diagnosis not present

## 2023-09-15 DIAGNOSIS — R45851 Suicidal ideations: Secondary | ICD-10-CM | POA: Diagnosis present

## 2023-09-15 DIAGNOSIS — F1521 Other stimulant dependence, in remission: Secondary | ICD-10-CM | POA: Diagnosis present

## 2023-09-15 DIAGNOSIS — Z820 Family history of epilepsy and other diseases of the nervous system: Secondary | ICD-10-CM | POA: Diagnosis not present

## 2023-09-15 MED ORDER — TRAZODONE HCL 50 MG PO TABS
50.0000 mg | ORAL_TABLET | Freq: Every evening | ORAL | Status: DC | PRN
  Filled 2023-09-15: qty 1

## 2023-09-15 MED ORDER — LORAZEPAM 2 MG/ML IJ SOLN: 2.0000 mg | Freq: Three times a day (TID) | INTRAMUSCULAR | Status: DC | PRN

## 2023-09-15 MED ORDER — DIPHENHYDRAMINE HCL 25 MG PO CAPS: 50.0000 mg | ORAL_CAPSULE | Freq: Three times a day (TID) | ORAL | Status: DC | PRN

## 2023-09-15 MED ORDER — MAGNESIUM HYDROXIDE 400 MG/5ML PO SUSP: 30.0000 mL | Freq: Every day | ORAL | Status: DC | PRN

## 2023-09-15 MED ORDER — DIPHENHYDRAMINE HCL 50 MG/ML IJ SOLN: 50.0000 mg | Freq: Three times a day (TID) | INTRAMUSCULAR | Status: DC | PRN

## 2023-09-15 MED ORDER — ACETAMINOPHEN 325 MG PO TABS: 650.0000 mg | ORAL_TABLET | Freq: Four times a day (QID) | ORAL | Status: DC | PRN

## 2023-09-15 MED ORDER — HALOPERIDOL LACTATE 5 MG/ML IJ SOLN: 5.0000 mg | Freq: Three times a day (TID) | INTRAMUSCULAR | Status: DC | PRN

## 2023-09-15 MED ORDER — NICOTINE POLACRILEX 2 MG MT GUM: 2.0000 mg | CHEWING_GUM | OROMUCOSAL | Status: DC | PRN

## 2023-09-15 MED ORDER — HALOPERIDOL LACTATE 5 MG/ML IJ SOLN: 10.0000 mg | Freq: Three times a day (TID) | INTRAMUSCULAR | Status: DC | PRN

## 2023-09-15 MED ORDER — ALUM & MAG HYDROXIDE-SIMETH 200-200-20 MG/5ML PO SUSP: 30.0000 mL | ORAL | Status: DC | PRN

## 2023-09-15 MED ORDER — HALOPERIDOL 5 MG PO TABS: 5.0000 mg | ORAL_TABLET | Freq: Three times a day (TID) | ORAL | Status: DC | PRN

## 2023-09-15 MED ORDER — HYDROXYZINE HCL 25 MG PO TABS
25.0000 mg | ORAL_TABLET | Freq: Three times a day (TID) | ORAL | Status: DC | PRN
  Filled 2023-09-15: qty 1

## 2023-09-15 MED ORDER — WHITE PETROLATUM EX OINT
TOPICAL_OINTMENT | CUTANEOUS | Status: AC
  Filled 2023-09-15: qty 5

## 2023-09-15 MED ORDER — BACITRACIN ZINC 500 UNIT/GM EX OINT
TOPICAL_OINTMENT | Freq: Two times a day (BID) | CUTANEOUS | Status: DC
  Administered 2023-09-15: 2 via TOPICAL
  Filled 2023-09-15: qty 1.8

## 2023-09-15 NOTE — ED Notes (Signed)
Pt refused EKG. MD aware. MD wants EKG at later time if required for inpatient admission.

## 2023-09-15 NOTE — ED Notes (Signed)
Patient report was called to Vikki Ports, RN at [facility]

## 2023-09-15 NOTE — BHH Group Notes (Signed)
BHH Group Notes:  (Nursing/MHT/Case Management/Adjunct)  Date:  09/15/2023  Time:  10:09 PM  Type of Therapy:  Psychoeducational Skills  Participation Level:  Did Not Attend  Participation Quality:  Resistant  Affect:  Resistant  Cognitive:  Lacking  Insight:  None  Engagement in Group:  None  Modes of Intervention:  Education  Summary of Progress/Problems: The patient did not attend group this evening.   Susan Graves S 09/15/2023, 10:09 PM

## 2023-09-15 NOTE — ED Notes (Signed)
Safe transport contacted and transport arranged.

## 2023-09-15 NOTE — Tx Team (Signed)
Initial Treatment Plan 09/15/2023 9:43 PM [name] [id number]    PATIENT STRESSORS: Marital or family conflict   Medication change or noncompliance     PATIENT STRENGTHS: Average or above average intelligence  Motivation for treatment/growth    PATIENT IDENTIFIED PROBLEMS:   depression  anxiety  Suicidal ideation  "Get into therapy"  "Get on medication"  "Talk to someone"         DISCHARGE CRITERIA:  Adequate post-discharge living arrangements Improved stabilization in mood, thinking, and/or behavior Motivation to continue treatment in a less acute level of care Verbal commitment to aftercare and medication compliance  PRELIMINARY DISCHARGE PLAN: Attend aftercare/continuing care group Outpatient therapy Participate in family therapy  PATIENT/FAMILY INVOLVEMENT: This treatment plan has been presented to and reviewed with the patient, Susan Graves,  The patient and family have been given the opportunity to ask questions and make suggestions.  Earnest Conroy, RN 09/15/2023, 9:43 PM

## 2023-09-15 NOTE — ED Notes (Signed)
Pt assisted to safe transport vehicle. Pt belongings and paperwork given to safe transport.
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
## 2023-09-15 NOTE — ED Notes (Signed)
1 personal belonging bag placed in locker 36

## 2023-09-15 NOTE — Progress Notes (Signed)
BHH/BMU LCSW Progress Note   09/15/2023    5:23 PM  Susan Graves   [id number]   Type of Contact and Topic:  Psychiatric Bed Placement   Pt accepted to [facility] 404-1    Patient meets inpatient criteria per Dahlia Byes, NP   The attending provider will be Dr. Abbott Pao   Call report to [phone]    Latricia Heft, Paramedic @ [facility] ED notified.     Pt scheduled  to arrive at [facility] TODAY @ 2000.   Susan Graves, MSW, LCSW-A  5:25 PM 09/15/2023

## 2023-09-15 NOTE — ED Notes (Signed)
Patient has been accepted to [facility] tonight, 09/15/23 after 20:00. The accepting physician is Abbott Pao, MD. Report can be called to [phone]

## 2023-09-16 ENCOUNTER — Encounter (HOSPITAL_COMMUNITY): Payer: Self-pay

## 2023-09-16 ENCOUNTER — Encounter (HOSPITAL_COMMUNITY): Payer: Self-pay | Admitting: Psychiatry

## 2023-09-16 DIAGNOSIS — F1220 Cannabis dependence, uncomplicated: Secondary | ICD-10-CM | POA: Diagnosis present

## 2023-09-16 DIAGNOSIS — F1520 Other stimulant dependence, uncomplicated: Secondary | ICD-10-CM | POA: Diagnosis present

## 2023-09-16 DIAGNOSIS — F17200 Nicotine dependence, unspecified, uncomplicated: Secondary | ICD-10-CM | POA: Diagnosis present

## 2023-09-16 MED ORDER — LORAZEPAM 1 MG PO TABS: 1.0000 mg | ORAL_TABLET | Freq: Four times a day (QID) | ORAL | Status: DC | PRN

## 2023-09-16 MED ORDER — ALUM & MAG HYDROXIDE-SIMETH 200-200-20 MG/5ML PO SUSP: 30.0000 mL | ORAL | Status: DC | PRN

## 2023-09-16 MED ORDER — POLYETHYLENE GLYCOL 3350 17 G PO PACK: 17.0000 g | PACK | Freq: Every day | ORAL | Status: DC | PRN

## 2023-09-16 MED ORDER — HYDROXYZINE HCL 25 MG PO TABS
25.0000 mg | ORAL_TABLET | Freq: Three times a day (TID) | ORAL | Status: DC | PRN
  Administered 2023-09-19: 25 mg via ORAL
  Filled 2023-09-16: qty 1

## 2023-09-16 MED ORDER — LAMOTRIGINE 25 MG PO TABS
25.0000 mg | ORAL_TABLET | Freq: Every day | ORAL | Status: DC
  Administered 2023-09-16 – 2023-09-21 (×6): 25 mg via ORAL
  Filled 2023-09-16 (×7): qty 1

## 2023-09-16 MED ORDER — DIPHENHYDRAMINE HCL 25 MG PO CAPS: 25.0000 mg | ORAL_CAPSULE | Freq: Four times a day (QID) | ORAL | Status: DC | PRN

## 2023-09-16 MED ORDER — ACETAMINOPHEN 325 MG PO TABS: 650.0000 mg | ORAL_TABLET | Freq: Four times a day (QID) | ORAL | Status: DC | PRN

## 2023-09-16 MED ORDER — LORAZEPAM 2 MG/ML IJ SOLN: 1.0000 mg | Freq: Four times a day (QID) | INTRAMUSCULAR | Status: DC | PRN

## 2023-09-16 MED ORDER — DIPHENHYDRAMINE HCL 50 MG/ML IJ SOLN: 25.0000 mg | Freq: Four times a day (QID) | INTRAMUSCULAR | Status: DC | PRN

## 2023-09-16 MED ORDER — MELATONIN 3 MG PO TABS
3.0000 mg | ORAL_TABLET | Freq: Every evening | ORAL | Status: DC | PRN
  Administered 2023-09-18 – 2023-09-20 (×2): 3 mg via ORAL
  Filled 2023-09-16 (×3): qty 1

## 2023-09-16 MED ORDER — HALOPERIDOL 5 MG PO TABS: 5.0000 mg | ORAL_TABLET | Freq: Four times a day (QID) | ORAL | Status: DC | PRN

## 2023-09-16 MED ORDER — ONDANSETRON HCL 4 MG PO TABS: 8.0000 mg | ORAL_TABLET | Freq: Three times a day (TID) | ORAL | Status: DC | PRN

## 2023-09-16 MED ORDER — NICOTINE POLACRILEX 2 MG MT GUM
2.0000 mg | CHEWING_GUM | OROMUCOSAL | Status: DC | PRN
  Administered 2023-09-16 – 2023-09-21 (×10): 2 mg via ORAL
  Filled 2023-09-16 (×5): qty 1

## 2023-09-16 MED ORDER — NICOTINE 14 MG/24HR TD PT24: 14.0000 mg | MEDICATED_PATCH | Freq: Every day | TRANSDERMAL | Status: DC | PRN

## 2023-09-16 MED ORDER — BISMUTH SUBSALICYLATE 262 MG PO CHEW: 524.0000 mg | CHEWABLE_TABLET | ORAL | Status: DC | PRN

## 2023-09-16 MED ORDER — HALOPERIDOL LACTATE 5 MG/ML IJ SOLN: 5.0000 mg | Freq: Four times a day (QID) | INTRAMUSCULAR | Status: DC | PRN

## 2023-09-16 MED ORDER — FLUOXETINE HCL 20 MG PO CAPS
20.0000 mg | ORAL_CAPSULE | Freq: Every day | ORAL | Status: DC
  Administered 2023-09-16 – 2023-09-19 (×4): 20 mg via ORAL
  Filled 2023-09-16 (×5): qty 1

## 2023-09-16 MED ORDER — BACITRACIN ZINC 500 UNIT/GM EX OINT
TOPICAL_OINTMENT | Freq: Two times a day (BID) | CUTANEOUS | Status: DC
  Administered 2023-09-16 – 2023-09-17 (×2): 31.5556 via TOPICAL
  Administered 2023-09-17 – 2023-09-19 (×5): 1 via TOPICAL
  Administered 2023-09-22: 31.5556 via TOPICAL
  Filled 2023-09-16 (×32): qty 0.9

## 2023-09-16 MED ORDER — SENNA 8.6 MG PO TABS: 1.0000 | ORAL_TABLET | Freq: Every evening | ORAL | Status: DC | PRN

## 2023-09-16 NOTE — Progress Notes (Signed)
   09/16/23 0528  15 Minute Checks  Location Bedroom  Visual Appearance Calm  Behavior Sleeping  Sleep (Behavioral Health Patients Only)  Calculate sleep? (Click Yes once per 24 hr at 0600 safety check) Yes  Documented sleep last 24 hours 9.5

## 2023-09-16 NOTE — BHH Group Notes (Signed)
Spiritual care group on grief and loss facilitated by Chaplain Dyanne Carrel, Bcc  Group Goal: Support / Education around grief and loss  Members engage in facilitated group support and psycho-social education.  Group Description:  Following introductions and group rules, group members engaged in facilitated group dialogue and support around topic of loss, with particular support around experiences of loss in their lives. Group Identified types of loss (relationships / self / things) and identified patterns, circumstances, and changes that precipitate losses. Reflected on thoughts / feelings around loss, normalized grief responses, and recognized variety in grief experience. Group encouraged individual reflection on safe space and on the coping skills that they are already utilizing.  Group drew on Adlerian / Rogerian and narrative framework  Patient Progress: Susan Graves attended part of group.  Though verbal participation was minimal, she demonstrated engagement in group conversation.

## 2023-09-16 NOTE — Group Note (Signed)
Date:  09/16/2023 Time:  11:56 AM  Group Topic/Focus:  Emotional Education:   The focus of this group is to discuss what feelings/emotions are, and how they are experienced.    Participation Level:  Active  Participation Quality:  Appropriate  Affect:  Appropriate  Cognitive:  Appropriate  Insight: Appropriate  Engagement in Group:  Engaged  Modes of Intervention:  Discussion and Education  Additional Comments:    Susan Graves 09/16/2023, 11:56 AM

## 2023-09-16 NOTE — Plan of Care (Signed)
°  Problem: Education: Goal: Emotional status will improve Outcome: Progressing Goal: Mental status will improve Outcome: Progressing Goal: Verbalization of understanding the information provided will improve Outcome: Progressing   Problem: Activity: Goal: Interest or engagement in activities will improve Outcome: Progressing Goal: Sleeping patterns will improve Outcome: Progressing   Problem: Coping: Goal: Ability to demonstrate self-control will improve Outcome: Progressing   Problem: Safety: Goal: Periods of time without injury will increase Outcome: Progressing

## 2023-09-16 NOTE — Group Note (Signed)
Recreation Therapy Group Note   Group Topic:Healthy Decision Making  Group Date: 09/16/2023 Start Time: 1013 End Time: 1033 Facilitators: Mayeli Bornhorst-McCall, LRT,CTRS Location: 400 Hall Dayroom   Group Topic: Decision Making, Problem Solving, Communication  Goal Area(s) Addresses:  Patient will effectively work with peer towards shared goal.  Patient will identify factors that guided their decision making.  Patient will pro-socially communicate ideas during group session.   Intervention: Survival Scenario - pencil, paper  Group Description: Patients were given a scenario that they were going to be stranded on a deserted [place] for several months before being rescued. Writer tasked them with making a list of 15 things they would choose to bring with them for "survival". The list of items was prioritized most important to least. Each patient would come up with their own list, then work together to create a new list of 15 items while in a group of 3-5 peers. LRT discussed each person's list and how it differed from others. The debrief included discussion of priorities, good decisions versus bad decisions, and how it is important to think before acting so we can make the best decision possible. LRT tied the concept of effective communication among group members to patient's support systems outside of the hospital and its benefit post discharge.  Education: Pharmacist, community, Priorities, Support System, Discharge Planning   Education Outcome: Acknowledges education/In group clarification/Needs additional education   Affect/Mood: Appropriate   Participation Level: N/A   Participation Quality: N/A   Behavior: N/A   Speech/Thought Process: N/A   Insight: N/A   Judgement: N/A   Modes of Intervention: Activity   Patient Response to Interventions:  N/A   Education Outcome:  In group clarification offered    Clinical Observations/Individualized Feedback: Pt was called out of group  after instructions were given out and didn't return.      Plan: Continue to engage patient in RT group sessions 2-3x/week.   Notnamed Croucher-McCall, LRT,CTRS 09/16/2023 1:01 PM

## 2023-09-16 NOTE — Plan of Care (Signed)

## 2023-09-16 NOTE — Plan of Care (Signed)
  Problem: Education: Goal: Knowledge of [place] [facility] Education information/materials will improve Outcome: Progressing   

## 2023-09-16 NOTE — BH IP Treatment Plan (Signed)
Interdisciplinary Treatment and Diagnostic Plan Update  09/16/2023 Time of Session: 11:35AM Susan Graves MRN: [id number]  Principal Diagnosis: MDD (major depressive disorder), recurrent episode, severe (HCC)  Secondary Diagnoses: Principal Problem:   MDD (major depressive disorder), recurrent episode, severe (HCC)   Current Medications:  Current Facility-Administered Medications  Medication Dose Route Frequency Provider Last Rate Last Admin   acetaminophen (TYLENOL) tablet 650 mg  650 mg Oral Q6H PRN Augusto Gamble, MD       alum & mag hydroxide-simeth (MAALOX/MYLANTA) 200-200-20 MG/5ML suspension 30 mL  30 mL Oral Q4H PRN Augusto Gamble, MD       bacitracin ointment   Topical BID Abbott Pao, Nadir, MD       bismuth subsalicylate (PEPTO BISMOL) chewable tablet 524 mg  524 mg Oral Q3H PRN Augusto Gamble, MD       haloperidol (HALDOL) tablet 5 mg  5 mg Oral Q6H PRN Augusto Gamble, MD       And   LORazepam (ATIVAN) tablet 1 mg  1 mg Oral Q6H PRN Augusto Gamble, MD       And   diphenhydrAMINE (BENADRYL) capsule 25 mg  25 mg Oral Q6H PRN Augusto Gamble, MD       haloperidol lactate (HALDOL) injection 5 mg  5 mg Intramuscular Q6H PRN Augusto Gamble, MD       And   LORazepam (ATIVAN) injection 1 mg  1 mg Intramuscular Q6H PRN Augusto Gamble, MD       And   diphenhydrAMINE (BENADRYL) injection 25 mg  25 mg Intramuscular Q6H PRN Augusto Gamble, MD       hydrOXYzine (ATARAX) tablet 25 mg  25 mg Oral TID PRN Augusto Gamble, MD       nicotine (NICODERM CQ - dosed in mg/24 hours) patch 14 mg  14 mg Transdermal Daily PRN Augusto Gamble, MD       nicotine polacrilex (NICORETTE) gum 2 mg  2 mg Oral PRN Augusto Gamble, MD   2 mg at 09/16/23 0957   ondansetron (ZOFRAN) tablet 8 mg  8 mg Oral Q8H PRN Augusto Gamble, MD       polyethylene glycol (MIRALAX / GLYCOLAX) packet 17 g  17 g Oral Daily PRN Augusto Gamble, MD       senna (SENOKOT) tablet 8.6 mg  1 tablet Oral QHS PRN Augusto Gamble, MD       PTA Medications: No medications prior to  admission.    Patient Stressors: Marital or family conflict   Medication change or noncompliance    Patient Strengths: Average or above average intelligence  Motivation for treatment/growth   Treatment Modalities: Medication Management, Group therapy, Case management,  1 to 1 session with clinician, Psychoeducation, Recreational therapy.   Physician Treatment Plan for Primary Diagnosis: MDD (major depressive disorder), recurrent episode, severe (HCC) Long Term Goal(s):     Short Term Goals:    Medication Management: Evaluate patient's response, side effects, and tolerance of medication regimen.  Therapeutic Interventions: 1 to 1 sessions, Unit Group sessions and Medication administration.  Evaluation of Outcomes: Not Progressing  Physician Treatment Plan for Secondary Diagnosis: Principal Problem:   MDD (major depressive disorder), recurrent episode, severe (HCC)  Long Term Goal(s):     Short Term Goals:       Medication Management: Evaluate patient's response, side effects, and tolerance of medication regimen.  Therapeutic Interventions: 1 to 1 sessions, Unit Group sessions and Medication administration.  Evaluation of Outcomes: Not Progressing   RN Treatment Plan for  Primary Diagnosis: MDD (major depressive disorder), recurrent episode, severe (HCC) Long Term Goal(s): Knowledge of disease and therapeutic regimen to maintain health will improve  Short Term Goals: Ability to remain free from injury will improve, Ability to verbalize frustration and anger appropriately will improve, Ability to demonstrate self-control, Ability to participate in decision making will improve, Ability to verbalize feelings will improve, Ability to disclose and discuss suicidal ideas, Ability to identify and develop effective coping behaviors will improve, and Compliance with prescribed medications will improve  Medication Management: RN will administer medications as ordered by provider, will  assess and evaluate patient's response and provide education to patient for prescribed medication. RN will report any adverse and/or side effects to prescribing provider.  Therapeutic Interventions: 1 on 1 counseling sessions, Psychoeducation, Medication administration, Evaluate responses to treatment, Monitor vital signs and CBGs as ordered, Perform/monitor CIWA, COWS, AIMS and Fall Risk screenings as ordered, Perform wound care treatments as ordered.  Evaluation of Outcomes: Not Progressing   LCSW Treatment Plan for Primary Diagnosis: MDD (major depressive disorder), recurrent episode, severe (HCC) Long Term Goal(s): Safe transition to appropriate next level of care at discharge, Engage patient in therapeutic group addressing interpersonal concerns.  Short Term Goals: Engage patient in aftercare planning with referrals and resources, Increase social support, Increase ability to appropriately verbalize feelings, Increase emotional regulation, Facilitate acceptance of mental health diagnosis and concerns, Facilitate patient progression through stages of change regarding substance use diagnoses and concerns, Identify triggers associated with mental health/substance abuse issues, and Increase skills for wellness and recovery  Therapeutic Interventions: Assess for all discharge needs, 1 to 1 time with Social worker, Explore available resources and support systems, Assess for adequacy in community support network, Educate family and significant other(s) on suicide prevention, Complete Psychosocial Assessment, Interpersonal group therapy.  Evaluation of Outcomes: Not Progressing   Progress in Treatment: Attending groups: Yes. Participating in groups: Yes. Taking medication as prescribed: none scheduled Toleration medication: none scheduled Family/Significant other contact made: No, will contact:  consents pending Patient understands diagnosis: Yes. Discussing patient identified problems/goals with  staff: Yes. Medical problems stabilized or resolved: Yes. Denies suicidal/homicidal ideation: Yes. Issues/concerns per patient self-inventory: No.  New problem(s) identified: No, Describe:  none  New Short Term/Long Term Goal(s): medication stabilization, elimination of SI thoughts, development of comprehensive mental wellness plan.    Patient Goals:  "Figure out my medications and find more coping skills"  Discharge Plan or Barriers: Patient recently admitted. CSW will continue to follow and assess for appropriate referrals and possible discharge planning.    Reason for Continuation of Hospitalization: Depression Medication stabilization Suicidal ideation  Estimated Length of Stay: 5-7 days  Last 3 [place] Suicide Severity Risk Score: Flowsheet Row Admission (Current) [facility] from 09/14/2023 in [facility] at [facility] ED from 08/27/2021 in [facility] Department at [facility]  C-SSRS RISK CATEGORY High Risk High Risk No Risk       Last PHQ 2/9 Scores:    11/18/2020    9:04 PM 11/18/2020    5:57 PM  Depression screen PHQ 2/9  Decreased Interest 3 3  Down, Depressed, Hopeless 3 3  PHQ - 2 Score 6 6  Altered sleeping 3 3  Tired, decreased energy 3 3  Change in appetite 3 3  Feeling bad or failure about yourself  3 3  Trouble concentrating 3 3  Moving slowly or fidgety/restless 3 3  Suicidal thoughts  3 3  PHQ-9 Score 27 27  Difficult doing work/chores Somewhat difficult     Scribe for Treatment Team: Esmeralda Arthur 09/16/2023 12:47 PM

## 2023-09-16 NOTE — Group Note (Signed)
Date:  09/16/2023 Time:  9:27 PM  Group Topic/Focus:  Alcoholics Anonymous Meeting    Participation Level:  Active  Participation Quality:  Appropriate  Affect:  Appropriate  Cognitive:  Appropriate  Insight: Appropriate  Engagement in Group:  Engaged  Modes of Intervention:  Discussion and Support  Additional Comments:  Patient attend AA  Susan Graves 09/16/2023, 9:27 PM

## 2023-09-16 NOTE — Progress Notes (Signed)
   09/16/23 0800  Psych Admission Type (Psych Patients Only)  Admission Status Voluntary  Psychosocial Assessment  Patient Complaints Anxiety;Depression  Eye Contact Fair  Facial Expression Sad  Affect Anxious;Depressed  Speech Logical/coherent  Interaction Guarded  Motor Activity Other (Comment) (WDL)  Appearance/Hygiene In scrubs  Behavior Characteristics Appropriate to situation  Mood Depressed;Anxious  Thought Process  Coherency Circumstantial  Content Blaming self  Delusions None reported or observed  Perception WDL  Hallucination None reported or observed  Judgment Impaired  Confusion None  Danger to Self  Current suicidal ideation? Denies  Danger to Others  Danger to Others None reported or observed
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
## 2023-09-16 NOTE — [facility] Suicide Risk Assessment (Signed)
Suicide Risk Assessment  Admission Assessment    [facility] Admission Suicide Risk Assessment  Nursing information obtained from:  Patient, Review of record Demographic factors:  Gay, lesbian, or bisexual orientation Current Mental Status:  Self-harm behaviors, Self-harm thoughts, Intention to act on suicide plan Loss Factors:  Loss of significant relationship Historical Factors:  Prior suicide attempts, Impulsivity Risk Reduction Factors:  NA  Total Time spent with patient: 1.5 hours Principal Problem: MDD (major depressive disorder), recurrent episode, severe (HCC) Diagnosis:  Principal Problem:   MDD (major depressive disorder), recurrent episode, severe (HCC) Active Problems:   Cannabis use disorder, severe, dependence (HCC)   Tobacco use disorder, moderate, dependence   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  World Science writer [facility]). Score between 0-7:  no or low risk or alcohol related problems. Score between 8-15:  moderate risk of alcohol related problems. Score between 16-19:  high risk of alcohol related problems. Score 20 or above:  warrants further diagnostic evaluation for alcohol dependence and treatment.  CLINICAL FACTORS:   Alcohol/Substance Abuse/Dependencies More than one psychiatric diagnosis  Musculoskeletal: Strength & Muscle Tone: within normal limits Gait & Station: normal Patient leans: N/A  Psychiatric Specialty Exam  Presentation  General Appearance:  Appropriate for Environment; Fairly Groomed  Eye Contact: Good  Speech: Clear and Coherent  Speech Volume: Normal  Handedness:No data  recorded  Mood and Affect  Mood: -- ("I'm here to get help")  Duration of Depression Symptoms:  Greater than two weeks  Affect: Appropriate; Congruent; Full Range; Tearful; Depressed   Thought Process  Thought Processes: Coherent; Goal Directed; Linear  Descriptions of Associations: Intact  Orientation: Full (Time, Place and Person)  Thought Content: WDL  History of Schizophrenia/Schizoaffective disorder: No  Duration of Psychotic Symptoms:No data recorded Hallucinations: Hallucinations: None  Ideas of Reference: None  Suicidal Thoughts: Suicidal Thoughts: No  Homicidal Thoughts: Homicidal Thoughts: No   Sensorium  Memory: Immediate Good; Recent Good; Remote Good  Judgment: Poor  Insight: Lacking   Executive Functions  Concentration: Good  Attention Span: Good  Recall: Good  Fund of Knowledge: Good  Language: Good   Psychomotor Activity  Psychomotor Activity: Psychomotor Activity: Normal   Assets  Assets: Resilience   Sleep  Sleep: Sleep: Good   Physical Exam: Physical Exam Vitals and nursing note reviewed.  HENT:     Head: Normocephalic and atraumatic.  Pulmonary:     Effort: Pulmonary effort is normal.  Musculoskeletal:     Cervical back: Normal range of motion.  Skin:    Comments: Cut on R side of neck  Neurological:     General: No focal deficit present.     Mental Status: She is alert.    Review of Systems  Constitutional: Negative.   Respiratory: Negative.    Cardiovascular: Negative.   Gastrointestinal: Negative.   Genitourinary: Negative.   Psychiatric/Behavioral:         Psychiatric subjective data addressed in PSE or HPI / daily subjective report   Blood pressure 117/78, pulse (!) 114, temperature 98.3 F (36.8 C), temperature source Oral, resp. rate 20, height 5\' 7"  (1.702 m), weight 76.7 kg, last menstrual period 09/11/2023, SpO2 99%. Body  mass index is 26.47 kg/m.  COGNITIVE FEATURES THAT  CONTRIBUTE TO RISK:  Thought constriction (tunnel vision)    SUICIDE RISK:   Moderate:  Frequent suicidal ideation with limited intensity, and duration, some specificity in terms of plans, no associated intent, good self-control, limited dysphoria/symptomatology, some risk factors present, and identifiable protective factors, including available and accessible social support.  PLAN OF CARE: see H&P for full plan of care  I certify that inpatient services furnished can reasonably be expected to improve the patient's condition.   Signed: Augusto Gamble, MD 09/16/2023, 1:38 PM

## 2023-09-17 DIAGNOSIS — F332 Major depressive disorder, recurrent severe without psychotic features: Secondary | ICD-10-CM | POA: Diagnosis not present

## 2023-09-17 LAB — LIPID PANEL
Cholesterol: 222 mg/dL — ABNORMAL HIGH (ref 0–200)
HDL: 37 mg/dL — ABNORMAL LOW (ref >40)
LDL Cholesterol: 162 mg/dL — ABNORMAL HIGH (ref 0–99)
Total CHOL/HDL Ratio: 6 {ratio}
Triglycerides: 117 mg/dL (ref <150)
VLDL: 23 mg/dL (ref 0–40)

## 2023-09-17 LAB — RAPID HIV SCREEN (HIV 1/2 AB+AG)
HIV 1/2 Antibodies: NONREACTIVE
HIV-1 P24 Antigen - HIV24: NONREACTIVE

## 2023-09-17 LAB — TSH: TSH: 1.043 u[IU]/mL (ref 0.350–4.500)

## 2023-09-17 NOTE — Group Note (Signed)
Recreation Therapy Group Note   Group Topic:Animal Assisted Therapy   Group Date: 09/17/2023 Start Time: 2376 End Time: 1033 Facilitators: Lc Joynt-McCall, LRT,CTRS Location: [address] Dayroom   Animal-Assisted Activity (AAA) Program Checklist/Progress Notes Patient Eligibility Criteria Checklist & Daily Group note for Rec Tx Intervention  AAA/T Program Assumption of Risk Form signed by Patient/ or Parent Legal Guardian Yes  Patient is free of allergies or severe asthma Yes  Patient reports no fear of animals Yes  Patient reports no history of cruelty to animals Yes  Patient understands his/her participation is voluntary Yes  Patient washes hands before animal contact Yes  Patient washes hands after animal contact Yes  Education: Hand Washing, Appropriate Animal Interaction   Education Outcome: Acknowledges education.    Affect/Mood: Appropriate   Participation Level: Engaged   Participation Quality: Independent   Behavior: Appropriate   Speech/Thought Process: Focused   Insight: Good   Judgement: Good   Modes of Intervention: Teaching laboratory technician   Patient Response to Interventions:  Engaged   Education Outcome:  In group clarification offered    Clinical Observations/Individualized Feedback: Patient attended session and interacted appropriately with therapy dog and peers. Patient asked appropriate questions about therapy dog and his training. Patient shared stories about their pets at home with group.      Plan: Continue to engage patient in RT group sessions 2-3x/week.   Doralee Kocak-McCall, LRT,CTRS 09/17/2023 12:37 PM

## 2023-09-17 NOTE — Progress Notes (Signed)
   09/16/23 2000  Psych Admission Type (Psych Patients Only)  Admission Status Voluntary  Psychosocial Assessment  Patient Complaints Anxiety;Depression  Eye Contact Fair  Facial Expression Sad  Affect Anxious;Depressed  Speech Logical/coherent  Interaction Guarded  Motor Activity Other (Comment) (WDL)  Appearance/Hygiene Unremarkable  Behavior Characteristics Appropriate to situation  Mood Depressed;Anxious  Thought Process  Coherency Circumstantial  Content Blaming self  Delusions None reported or observed  Perception WDL  Hallucination None reported or observed  Judgment Impaired  Confusion None  Danger to Self  Current suicidal ideation? Denies  Danger to Others  Danger to Others None reported or observed

## 2023-09-17 NOTE — Group Note (Signed)
LCSW Group Therapy Note  Group Date: 09/17/2023 Start Time: 1110 End Time: 1210   Type of Therapy and Topic:  Group Therapy - Healthy vs Unhealthy Coping Skills  Participation Level:  Active   Description of Group The focus of this group was to determine what unhealthy coping techniques typically are used by group members and what healthy coping techniques would be helpful in coping with various problems. Patients were guided in becoming aware of the differences between healthy and unhealthy coping techniques. Patients were asked to identify 2-3 healthy coping skills they would like to learn to use more effectively.  Therapeutic Goals Patients learned that coping is what human beings do all day long to deal with various situations in their lives Patients defined and discussed healthy vs unhealthy coping techniques Patients identified their preferred coping techniques and identified whether these were healthy or unhealthy Patients determined 2-3 healthy coping skills they would like to become more familiar with and use more often. Patients provided support and ideas to each other   Summary of Patient Progress:  During group, patient expressed an interest in changing bad habits by challenging herself to do things she is not used to doing. Patient proved open to input from peers and feedback from CSW. Patient demonstrated good insight into the subject matter, was respectful of peers, and participated throughout the entire session.   Therapeutic Modalities Cognitive Behavioral Therapy Motivational Interviewing  Susan Dicker, LCSW 09/17/2023  1:54 PM
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
## 2023-09-17 NOTE — Plan of Care (Signed)
°  Problem: Education: Goal: Emotional status will improve Outcome: Progressing Goal: Mental status will improve Outcome: Progressing Goal: Verbalization of understanding the information provided will improve Outcome: Progressing   Problem: Activity: Goal: Interest or engagement in activities will improve Outcome: Progressing Goal: Sleeping patterns will improve Outcome: Progressing   Problem: Coping: Goal: Ability to demonstrate self-control will improve Outcome: Progressing   Problem: Safety: Goal: Periods of time without injury will increase Outcome: Progressing
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
## 2023-09-17 NOTE — BHH Group Notes (Signed)
BHH Group Notes:  (Nursing/MHT/Case Management/Adjunct)  Date:  09/17/2023  Time:  11:12 PM  Type of Therapy:  Psychoeducational Skills  Participation Level:  Minimal  Participation Quality:  Attentive  Affect:  Flat  Cognitive:  Lacking  Insight:  Lacking  Engagement in Group:  Lacking  Modes of Intervention:  Education  Summary of Progress/Problems: Patient rated her day as a 7 or 8 out of 19. She stated that she had a good day since her peers made her laugh. She had nothing else to share with the group.   Heike Pounds S 09/17/2023, 11:12 PM

## 2023-09-17 NOTE — Progress Notes (Signed)
   09/17/23 0753  Psych Admission Type (Psych Patients Only)  Admission Status Voluntary  Psychosocial Assessment  Patient Complaints Anxiety;Depression  Eye Contact Fair  Facial Expression Sad  Affect Anxious;Depressed  Speech Logical/coherent  Interaction Assertive  Motor Activity Other (Comment) (WDL)  Appearance/Hygiene Unremarkable  Behavior Characteristics Appropriate to situation  Mood Depressed;Anxious  Thought Process  Coherency Circumstantial  Content Blaming self  Delusions None reported or observed  Perception WDL  Hallucination None reported or observed  Judgment Impaired  Confusion None  Danger to Self  Current suicidal ideation? Denies  Danger to Others  Danger to Others None reported or observed

## 2023-09-17 NOTE — [facility] Suicide Risk Assessment (Signed)
[facility] INPATIENT:  Family/Significant Other Suicide Prevention Education  Suicide Prevention Education:  Patient Refusal for Family/Significant Other Suicide Prevention Education: The patient Susan Graves has refused to provide written consent for family/significant other to be provided Family/Significant Other Suicide Prevention Education during admission and/or prior to discharge.  Physician notified.  Jennie Bolar R 09/17/2023, 8:40 PM

## 2023-09-18 DIAGNOSIS — F332 Major depressive disorder, recurrent severe without psychotic features: Secondary | ICD-10-CM | POA: Diagnosis not present

## 2023-09-18 LAB — HEPATITIS PANEL, ACUTE
HCV Ab: NONREACTIVE
Hep A IgM: NONREACTIVE
Hep B C IgM: NONREACTIVE
Hepatitis B Surface Ag: NONREACTIVE

## 2023-09-18 LAB — GC/CHLAMYDIA PROBE AMP (~~LOC~~) NOT AT ARMC
Chlamydia: POSITIVE — AB
Comment: NEGATIVE
Comment: NORMAL
Neisseria Gonorrhea: NEGATIVE

## 2023-09-18 LAB — RPR: RPR Ser Ql: NONREACTIVE

## 2023-09-18 NOTE — Plan of Care (Signed)
Problem: Safety: Goal: Periods of time without injury will increase Outcome: Progressing   Problem: Health Behavior/Discharge Planning: Goal: Compliance with therapeutic regimen will improve Outcome: Progressing   Problem: Self-Concept: Goal: Will verbalize positive feelings about self Outcome: Progressing

## 2023-09-18 NOTE — [facility] Group Notes (Signed)
Spiritual care group facilitated by Chaplain Dyanne Carrel, [facility]  Group focused on topic of strength. Group members reflected on what thoughts and feelings emerge when they hear this topic. They then engaged in facilitated dialog around how strength is present in their lives. This dialog focused on representing what strength had been to them in their lives (images and patterns given) and what they saw as helpful in their life now (what they needed / wanted).  Activity drew on narrative framework.  Patient Progress: Susan Graves attended group and actively engaged and participated in group conversation and activities. Her comments demonstrated good insight and contributed positively to the group conversation.

## 2023-09-18 NOTE — Group Note (Signed)
Date:  09/18/2023 Time:  4:05 PM  Group Topic/Focus:  Dimensions of Wellness:   The focus of this group is to introduce the topic of wellness and discuss the role each dimension of wellness plays in total health.    Participation Level:  Active  Participation Quality:  Appropriate  Affect:  Appropriate  Cognitive:  Appropriate  Insight: Appropriate  Engagement in Group:  Engaged  Modes of Intervention:  Activity, Discussion, and Socialization  Additional Comments:   Pt attended the Social Wellness group. Pt actively participated in the Telephone Game activity. Pt practiced active listening and effective communication skills. Pt shared that she learned the importance of actively listening for understanding and not just to respond.  Edmund Hilda Patty Lopezgarcia 09/18/2023, 4:05 PM

## 2023-09-18 NOTE — Plan of Care (Signed)
  Problem: Education: Goal: Emotional status will improve Outcome: Progressing Goal: Mental status will improve Outcome: Progressing Goal: Verbalization of understanding the information provided will improve Outcome: Progressing   Problem: Activity: Goal: Interest or engagement in activities will improve Outcome: Progressing Goal: Sleeping patterns will improve Outcome: Progressing   Problem: Coping: Goal: Ability to verbalize frustrations and anger appropriately will improve Outcome: Progressing Goal: Ability to demonstrate self-control will improve Outcome: Progressing   Problem: Safety: Goal: Periods of time without injury will increase Outcome: Progressing

## 2023-09-18 NOTE — Group Note (Signed)
Recreation Therapy Group Note   Group Topic:Team Building  Group Date: 09/18/2023 Start Time: 0930 End Time: 0952 Facilitators: Anelly Samarin-McCall, LRT,CTRS Location: [address] Dayroom   Group Topic: Communication, Team Building, Problem Solving  Goal Area(s) Addresses:  Patient will effectively work with peer towards shared goal.  Patient will identify skills used to make activity successful.  Patient will share challenges and verbalize solution-driven approaches used. Patient will identify how skills used during activity can be used to reach post d/c goals.   Intervention: STEM Activity   Group Description: [employer]. Patients were provided the following materials: 4 drinking straws, 5 rubber bands, 5 paper clips, 2 index cards and 2 drinking cups. Using the provided materials patients were asked to build a launching mechanism to launch a ping pong ball across the room, approximately 10 feet. Patients were divided into teams of 3-5. Instructions required all materials be incorporated into the device, functionality of items left to the peer group's discretion.  Education: Pharmacist, community, Scientist, physiological, Air cabin crew, Building control surveyor.   Education Outcome: Acknowledges education/In group clarification offered/Needs additional education.    Affect/Mood: Appropriate   Participation Level: Engaged   Participation Quality: Independent   Behavior: Appropriate   Speech/Thought Process: Focused   Insight: Good   Judgement: Good   Modes of Intervention: STEM Activity   Patient Response to Interventions:  Engaged   Education Outcome:  In group clarification offered    Clinical Observations/Individualized Feedback: Pt was excited and focused. Pt was fully concentrated on how to complete the launcher. Pt went back and forth with peers exchanging ideas until they agreed on a concept. Pt took turns with peers taking the lead at certain points during the development of  the launcher.     Plan: Continue to engage patient in RT group sessions 2-3x/week.   Bleu Minerd-McCall, LRT,CTRS 09/18/2023 11:55 AM
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
## 2023-09-18 NOTE — Group Note (Unsigned)
Date:  09/19/2023 Time:  1:04 AM  Group Topic/Focus:  Narcotics Anonymous (NA) Meeting    Participation Level:  Active  Participation Quality:  Appropriate  Affect:  Appropriate  Cognitive:  Appropriate  Insight: Appropriate  Engagement in Group:  Engaged  Modes of Intervention:  Socialization and Support  Additional Comments:  Patient attended NA Meeting  Susan Graves 09/19/2023, 1:04 AM

## 2023-09-18 NOTE — Progress Notes (Signed)
   09/17/23 2015  Psych Admission Type (Psych Patients Only)  Admission Status Voluntary  Psychosocial Assessment  Patient Complaints None  Eye Contact Fair  Facial Expression Other (Comment) (Pleasant)  Affect Euphoric  Speech Logical/coherent  Interaction Assertive  Motor Activity Other (Comment) (WDL)  Appearance/Hygiene Unremarkable  Behavior Characteristics Appropriate to situation  Mood Pleasant  Thought Process  Coherency Circumstantial  Content WDL  Delusions None reported or observed  Perception WDL  Hallucination None reported or observed  Judgment Impaired  Confusion None  Danger to Self  Current suicidal ideation? Denies  Description of Agreement Verbal  Danger to Others  Danger to Others None reported or observed

## 2023-09-19 DIAGNOSIS — F332 Major depressive disorder, recurrent severe without psychotic features: Secondary | ICD-10-CM | POA: Diagnosis not present

## 2023-09-19 MED ORDER — FLUOXETINE HCL 20 MG PO CAPS
20.0000 mg | ORAL_CAPSULE | Freq: Once | ORAL | Status: AC
  Administered 2023-09-19: 20 mg via ORAL
  Filled 2023-09-19: qty 1

## 2023-09-19 MED ORDER — DOXYCYCLINE HYCLATE 100 MG PO TABS
100.0000 mg | ORAL_TABLET | Freq: Two times a day (BID) | ORAL | Status: DC
  Administered 2023-09-19 – 2023-09-22 (×7): 100 mg via ORAL
  Filled 2023-09-19 (×9): qty 1

## 2023-09-19 MED ORDER — FLUOXETINE HCL 20 MG PO CAPS
40.0000 mg | ORAL_CAPSULE | Freq: Every day | ORAL | Status: DC
  Administered 2023-09-20: 40 mg via ORAL
  Administered 2023-09-21: 20 mg via ORAL
  Filled 2023-09-19 (×3): qty 2

## 2023-09-19 NOTE — Progress Notes (Signed)
DAR NOTE: Patient presents with anxious affect and irritable mood.  Denies suicidal thoughts, auditory and visual hallucinations.  Patient observed yelling and crying on the phone several times.  Patient reported staff to her mom accusing staff of turning the phone off staff would not let her use the phone.  Meanwhile patient had been on the phone majority of this shift talking to multiple people at different times. Stitches in place and intact, no sign/symptoms of infection noted.  Rates depression at 1, hopelessness at 0, and anxiety at 1.  Maintained on routine safety checks.  Medications given as prescribed.  Support and encouragement offered as needed.  Attended group and participated.  States goal for today is "my mindset, my thoughts."  Patient observed socializing with peers in the dayroom.  Patient is safe on and off the unit.

## 2023-09-19 NOTE — Plan of Care (Signed)
°  Problem: Education: °Goal: Emotional status will improve °Outcome: Progressing °Goal: Mental status will improve °Outcome: Progressing °  °Problem: Activity: °Goal: Interest or engagement in activities will improve °Outcome: Progressing °  °

## 2023-09-19 NOTE — Progress Notes (Signed)
   09/19/23 2108  Psych Admission Type (Psych Patients Only)  Admission Status Voluntary  Psychosocial Assessment  Patient Complaints Anxiety  Eye Contact Fair  Facial Expression Animated  Affect Anxious  Speech Logical/coherent  Interaction Assertive  Motor Activity Other (Comment) (WDL)  Appearance/Hygiene Unremarkable  Behavior Characteristics Cooperative;Appropriate to situation  Mood Anxious  Thought Process  Coherency Circumstantial  Content Blaming others  Delusions None reported or observed  Perception WDL  Hallucination None reported or observed  Judgment Impaired  Confusion None  Danger to Self  Current suicidal ideation? Denies  Danger to Others  Danger to Others None reported or observed

## 2023-09-19 NOTE — Progress Notes (Signed)
CSW observed pt being upset while on the phone regarding her housing situation. Pt called mother, Arville Go [phone] who gave information surrounding residential treatment facility, [facility]. Pt requested a referral information to be sent.  CSW received written consent from pt and clinical were sent. [facility] emailed packet for pt to complete.   CSW spoke with [facility] in [place] [phone] ext [phone], awaiting response to determine if pt has been accepted.
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
## 2023-09-19 NOTE — Plan of Care (Signed)
  Problem: Education: Goal: Emotional status will improve Outcome: Progressing Goal: Mental status will improve Outcome: Progressing   

## 2023-09-19 NOTE — Progress Notes (Signed)
   09/18/23 2000  Psych Admission Type (Psych Patients Only)  Admission Status Voluntary  Psychosocial Assessment  Patient Complaints Anxiety  Eye Contact Fair  Facial Expression Animated  Affect Anxious  Speech Logical/coherent  Interaction Assertive  Motor Activity Restless  Appearance/Hygiene Unremarkable  Behavior Characteristics Cooperative;Appropriate to situation  Mood Anxious;Pleasant  Thought Process  Coherency Circumstantial  Content Blaming self  Delusions None reported or observed  Perception WDL  Hallucination None reported or observed  Judgment WDL  Confusion None  Danger to Self  Current suicidal ideation? Denies  Description of Agreement Verbal  Danger to Others  Danger to Others None reported or observed

## 2023-09-19 NOTE — [facility] Group Notes (Signed)
[facility] Group Notes:  (Nursing/MHT/Case Management/Adjunct)  Date:  09/19/2023  Time:  2000  Type of Therapy:   Wrap up group  Participation Level:  Active  Participation Quality:  Appropriate, Attentive, Sharing, and Supportive  Affect:  Appropriate  Cognitive:  Appropriate  Insight:  Improving  Engagement in Group:  Engaged  Modes of Intervention:  Clarification, Education, and Socialization  Summary of Progress/Problems: Positive thinking and self care were discussed.   Susan Graves S 09/19/2023, 9:04 PM

## 2023-09-19 NOTE — Group Note (Signed)
LCSW Group Therapy Note  Group Date: 09/19/2023 Start Time: 1100 End Time: 1200   Type of Therapy and Topic:  Group Therapy - Healthy vs Unhealthy Coping Skills  Participation Level:  Minimal   Description of Group The focus of this group was to determine what unhealthy coping techniques typically are used by group members and what healthy coping techniques would be helpful in coping with various problems. Patients were guided in becoming aware of the differences between healthy and unhealthy coping techniques. Patients were asked to identify 2-3 healthy coping skills they would like to learn to use more effectively.  Therapeutic Goals Patients learned that coping is what human beings do all day long to deal with various situations in their lives Patients defined and discussed healthy vs unhealthy coping techniques Patients identified their preferred coping techniques and identified whether these were healthy or unhealthy Patients determined 2-3 healthy coping skills they would like to become more familiar with and use more often. Patients provided support and ideas to each other   Summary of Patient Progress:  During group, Anaija expressed one coping skill. Patient proved open to input from peers and feedback from CSW. Patient demonstrated some insight into the subject matter, was respectful of peers, and participated throughout the entire session.   Therapeutic Modalities Cognitive Behavioral Therapy Motivational Interviewing  Marinda Elk, [place] 09/19/2023  12:24 PM

## 2023-09-19 NOTE — Group Note (Signed)
Date:  09/19/2023 Time:  10:52 AM  Group Topic/Focus:  Goals Group:   The focus of this group is to help patients establish daily goals to achieve during treatment and discuss how the patient can incorporate goal setting into their daily lives to aide in recovery. Orientation:   The focus of this group is to educate the patient on the purpose and policies of crisis stabilization and provide a format to answer questions about their admission.  The group details unit policies and expectations of patients while admitted.    Participation Level:  Active  Participation Quality:  Appropriate  Affect:  Appropriate  Cognitive:  Appropriate  Insight: Appropriate  Engagement in Group:  Engaged  Modes of Intervention:  Discussion  Additional Comments:    Kabeer Hoagland D Deiontae Rabel 09/19/2023, 10:52 AM

## 2023-09-20 ENCOUNTER — Encounter (HOSPITAL_COMMUNITY): Payer: Self-pay

## 2023-09-20 DIAGNOSIS — F332 Major depressive disorder, recurrent severe without psychotic features: Secondary | ICD-10-CM | POA: Diagnosis not present

## 2023-09-20 NOTE — Progress Notes (Signed)
   09/20/23 1000  Psych Admission Type (Psych Patients Only)  Admission Status Voluntary  Psychosocial Assessment  Patient Complaints Anxiety  Eye Contact Fair  Facial Expression Animated  Affect Anxious  Speech Logical/coherent  Interaction Assertive  Motor Activity Other (Comment) (WDL)  Appearance/Hygiene Unremarkable  Behavior Characteristics Cooperative;Appropriate to situation  Mood Anxious  Thought Process  Coherency Circumstantial  Content WDL  Delusions None reported or observed  Perception WDL  Hallucination None reported or observed  Judgment Impaired  Confusion None  Danger to Self  Current suicidal ideation? Denies  Description of Agreement verbal  Danger to Others  Danger to Others None reported or observed   Patient alert and oriented. Patient denies SI, HI, AVH, and pain. Scheduled medications administered to patient, per provider orders. Support and encouragement provided. Routine safety checks conducted every 15 minutes. Patient contracts for safety and remains safe on the unit.

## 2023-09-20 NOTE — Progress Notes (Signed)
On assessment at the medication window, patient complained of moderate anxiety.  Administered PRN Hydroxyaine per MAR per patient request.  Patient is safe on the unit as this time.

## 2023-09-20 NOTE — Progress Notes (Signed)
   09/20/23 2030  Psych Admission Type (Psych Patients Only)  Admission Status Voluntary  Psychosocial Assessment  Patient Complaints None  Eye Contact Fair  Facial Expression Animated  Affect Appropriate to circumstance  Speech Soft  Interaction Assertive  Motor Activity Other (Comment) (wnl)  Appearance/Hygiene Unremarkable  Behavior Characteristics Cooperative;Appropriate to situation  Mood Anxious  Thought Process  Coherency Circumstantial  Content WDL  Delusions None reported or observed  Perception WDL  Hallucination None reported or observed  Judgment Poor  Confusion None  Danger to Self  Current suicidal ideation? Denies  Danger to Others  Danger to Others None reported or observed

## 2023-09-20 NOTE — Progress Notes (Signed)
Chaplain met with Susan Graves as she thought about her plans for her future. She has goals for herself and wants to be able to find a shelter as a stepping stone for meeting those goals.  Chaplain provided listening and emotional support.   [address], Bcc Pager, [phone]

## 2023-09-20 NOTE — [facility] Group Notes (Signed)
The focus of this group is to help patients establish daily goals to achieve during treatment and discuss how the patient can incorporate goal setting into their daily lives to aide in recovery.    Scale 1-10 7 out of 10    Goal:   Talk with therapist

## 2023-09-20 NOTE — [facility] Group Notes (Signed)
[facility] Group Notes:  (Nursing/MHT/Case Management/Adjunct)  Date:  09/20/2023  Time:  9:16 PM  Type of Therapy:   AA group  Participation Level:  Active  Participation Quality:  Appropriate  Affect:  Appropriate  Cognitive:  Appropriate  Insight:  Appropriate  Engagement in Group:  Engaged  Modes of Intervention:  Education  Summary of Progress/Problems: Attended AA meeting.  Susan Graves 09/20/2023, 9:16 PM
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
## 2023-09-20 NOTE — BH IP Treatment Plan (Signed)
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   Cannabis use disorder, severe, dependence (HCC)   Tobacco use disorder, moderate, dependence   Current Medications:  Current Facility-Administered Medications  Medication Dose Route Frequency Provider Last Rate Last Admin   acetaminophen (TYLENOL) tablet 650 mg  650 mg Oral Q6H PRN Augusto Gamble, MD       alum & mag hydroxide-simeth (MAALOX/MYLANTA) 200-200-20 MG/5ML suspension 30 mL  30 mL Oral Q4H PRN Augusto Gamble, MD       bacitracin ointment   Topical BID Abbott Pao, Nadir, MD   1 Application at 09/19/23 1647   bismuth subsalicylate (PEPTO BISMOL) chewable tablet 524 mg  524 mg Oral Q3H PRN Augusto Gamble, MD       haloperidol (HALDOL) tablet 5 mg  5 mg Oral Q6H PRN Augusto Gamble, MD       And   LORazepam (ATIVAN) tablet 1 mg  1 mg Oral Q6H PRN Augusto Gamble, MD       And   diphenhydrAMINE (BENADRYL) capsule 25 mg  25 mg Oral Q6H PRN Augusto Gamble, MD       haloperidol lactate (HALDOL) injection 5 mg  5 mg Intramuscular Q6H PRN Augusto Gamble, MD       And   LORazepam (ATIVAN) injection 1 mg  1 mg Intramuscular Q6H PRN Augusto Gamble, MD       And   diphenhydrAMINE (BENADRYL) injection 25 mg  25 mg Intramuscular Q6H PRN Augusto Gamble, MD       doxycycline (VIBRA-TABS) tablet 100 mg  100 mg Oral Q12H Augusto Gamble, MD   100 mg at 09/20/23 7829   FLUoxetine (PROZAC) capsule 40 mg  40 mg Oral Daily Augusto Gamble, MD   40 mg at 09/20/23 0810   hydrOXYzine (ATARAX) tablet 25 mg  25 mg Oral TID PRN Augusto Gamble, MD   25 mg at 09/19/23 2108   lamoTRIgine (LAMICTAL) tablet 25 mg  25 mg Oral Rosanne Sack, MD   25 mg at  09/19/23 2108   melatonin tablet 3 mg  3 mg Oral QHS PRN Augusto Gamble, MD   3 mg at 09/18/23 2105   nicotine (NICODERM CQ - dosed in mg/24 hours) patch 14 mg  14 mg Transdermal Daily PRN Augusto Gamble, MD       nicotine polacrilex (NICORETTE) gum 2 mg  2 mg Oral PRN Augusto Gamble, MD   2 mg at 09/20/23 0716   ondansetron (ZOFRAN) tablet 8 mg  8 mg Oral Q8H PRN Augusto Gamble, MD       polyethylene glycol (MIRALAX / GLYCOLAX) packet 17 g  17 g Oral Daily PRN Augusto Gamble, MD       senna (SENOKOT) tablet 8.6 mg  1 tablet Oral QHS PRN Augusto Gamble, MD       PTA Medications: No medications prior to admission.    Patient Stressors: Marital or family conflict   Medication change or noncompliance    Patient Strengths: Average or above average intelligence  Motivation for treatment/growth   Treatment Modalities: Medication Management, Group therapy, Case management,  1 to 1 session with clinician, Psychoeducation, Recreational therapy.   Physician Treatment Plan for Primary Diagnosis: MDD (major depressive  disorder), recurrent episode, severe (HCC) Long Term Goal(s):     Short Term Goals:    Medication Management: Evaluate patient's response, side effects, and tolerance of medication regimen.  Therapeutic Interventions: 1 to 1 sessions, Unit Group sessions and Medication administration.  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  Therapeutic Interventions: 1 to 1 sessions, Unit Group sessions and Medication administration.  Evaluation of  Outcomes: Progressing   RN Treatment Plan for Primary Diagnosis: MDD (major depressive disorder), recurrent episode, severe (HCC) Long Term Goal(s): Knowledge of disease and therapeutic regimen to maintain health will improve  Short Term Goals: Ability to remain free from injury will improve, Ability to verbalize frustration and anger appropriately will improve, Ability to participate in decision making will improve, Ability to verbalize feelings will improve, Ability to identify and develop effective coping behaviors will improve, and Compliance with prescribed medications will improve  Medication Management: RN will administer medications as ordered by provider, will assess and evaluate patient's response and provide education to patient for prescribed medication. RN will report any adverse and/or side effects to prescribing provider.  Therapeutic Interventions: 1 on 1 counseling sessions, Psychoeducation, Medication administration, Evaluate responses to treatment, Monitor vital signs and CBGs as ordered, Perform/monitor CIWA, COWS, AIMS and Fall Risk screenings as ordered, Perform wound care treatments as ordered.  Evaluation of Outcomes: Progressing   LCSW Treatment Plan for Primary Diagnosis: MDD (major depressive disorder), recurrent episode, severe (HCC) Long Term Goal(s): Safe transition to appropriate next level of care at discharge, Engage patient in therapeutic group addressing interpersonal concerns.  Short Term Goals: Engage patient in aftercare planning with referrals and resources, Increase social support, Facilitate acceptance of mental health diagnosis and concerns, Facilitate patient progression through stages of change regarding substance use diagnoses and concerns, and Identify triggers associated with mental health/substance abuse issues  Therapeutic Interventions: Assess for all discharge needs, 1 to 1 time with Social worker, Explore available resources and support systems,  Assess for adequacy in community support network, Educate family and significant other(s) on suicide prevention, Complete Psychosocial Assessment, Interpersonal group therapy.  Evaluation of Outcomes: Progressing   Progress in Treatment: Attending groups: Yes. Participating in groups: Yes. Taking medication as prescribed: none scheduled Toleration medication: none scheduled Family/Significant other contact made: No, will contact: declined consents Patient understands diagnosis: Yes. Discussing patient identified problems/goals with staff: Yes. Medical problems stabilized or resolved: Yes. Denies suicidal/homicidal ideation: Yes. Issues/concerns per patient self-inventory: No.   New problem(s) identified: No, Describe:  none   New Short Term/Long Term Goal(s): medication stabilization, elimination of SI thoughts, development of comprehensive mental wellness plan.      Patient Goals:  "Figure out my medications and find more coping skills"   Discharge Plan or Barriers: Patient recently admitted. CSW will continue to follow and assess for appropriate referrals and possible discharge planning.      Reason for Continuation of Hospitalization: Depression Medication stabilization Suicidal ideation   Estimated Length of Stay: 3-4 days  Last 3 [place] Suicide Severity Risk Score: Flowsheet Row [facility]/14/2023 [facility] at [facility] ED from 08/27/2021 in [facility] Department at [facility]  C-SSRS RISK CATEGORY High Risk High Risk No Risk       Last PHQ 2/9 Scores:    11/18/2020    9:04 PM 11/18/2020    5:57 PM  Depression screen PHQ 2/9  Decreased Interest 3 3  Down, Depressed, Hopeless 3 3  PHQ - 2 Score 6 6  Altered sleeping 3 3  Tired, decreased energy 3 3  Change in appetite 3 3  Feeling bad or failure about yourself  3 3  Trouble  concentrating 3 3  Moving slowly or fidgety/restless 3 3  Suicidal thoughts 3 3  PHQ-9 Score 27 27  Difficult doing work/chores Somewhat difficult     Scribe for Treatment Team: Kathi Der, LCSWA 09/20/2023 1:32 PM

## 2023-09-20 NOTE — Group Note (Signed)
Recreation Therapy Group Note   Group Topic:Problem Solving  Group Date: 09/20/2023 Start Time: 0930 End Time: 0950 Facilitators: Mylia Pondexter-McCall, LRT,CTRS Location: [address] Dayroom   Group Topic: Communication, Team Building, Problem Solving  Goal Area(s) Addresses:  Patient will effectively work with peer towards shared goal.  Patient will identify skills used to make activity successful.  Patient will identify how skills used during activity can be applied to reach post d/c goals.   Intervention: STEM Activity- Glass blower/designer  Group Description: Tallest Pharmacist, community. In teams of 5-6, patients were given 11 craft pipe cleaners. Using the materials provided, patients were instructed to compete again the opposing team(s) to build the tallest free-standing structure from floor level. The activity was timed; difficulty increased by Clinical research associate as Production designer, theatre/television/film continued.  Systematically resources were removed with additional directions for example, placing one arm behind their back, working in silence, and shape stipulations. LRT facilitated post-activity discussion reviewing team processes and necessary communication skills involved in completion. Patients were encouraged to reflect how the skills utilized, or not utilized, in this activity can be incorporated to positively impact support systems post discharge.  Education: Pharmacist, community, Scientist, physiological, Discharge Planning   Education Outcome: Acknowledges education/In group clarification offered/Needs additional education.    Affect/Mood: N/A   Participation Level: Did not attend    Clinical Observations/Individualized Feedback:     Plan: Continue to engage patient in RT group sessions 2-3x/week.   Susan Graves, LRT,CTRS 09/20/2023 11:38 AM

## 2023-09-20 NOTE — Progress Notes (Signed)
   09/20/23 0547  15 Minute Checks  Location Bathroom/Shower  Visual Appearance Calm  Behavior Composed  Sleep (Behavioral Health Patients Only)  Calculate sleep? (Click Yes once per 24 hr at 0600 safety check) Yes  Documented sleep last 24 hours 8

## 2023-09-20 NOTE — Plan of Care (Signed)
  Problem: Education: Goal: Emotional status will improve Outcome: Progressing Goal: Mental status will improve Outcome: Progressing   Problem: Safety: Goal: Periods of time without injury will increase Outcome: Progressing   Problem: Education: Goal: Verbalization of understanding the information provided will improve Outcome: Progressing   Problem: Activity: Goal: Interest or engagement in activities will improve Outcome: Progressing

## 2023-09-21 DIAGNOSIS — F332 Major depressive disorder, recurrent severe without psychotic features: Secondary | ICD-10-CM | POA: Diagnosis not present

## 2023-09-21 MED ORDER — FLUOXETINE HCL 20 MG PO CAPS
20.0000 mg | ORAL_CAPSULE | Freq: Every day | ORAL | Status: DC
  Administered 2023-09-22: 20 mg via ORAL
  Filled 2023-09-21 (×2): qty 1
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
## 2023-09-21 NOTE — Plan of Care (Signed)
  Problem: Education: Goal: Knowledge of [facility] [place] [facility] Education information/materials will improve Outcome: Progressing Goal: Emotional status will improve Outcome: Progressing Goal: Mental status will improve Outcome: Progressing Goal: Verbalization of understanding the information provided will improve Outcome: Progressing   Problem: Activity: Goal: Interest or engagement in activities will improve Outcome: Progressing Goal: Sleeping patterns will improve Outcome: Progressing   Problem: Coping: Goal: Ability to verbalize frustrations and anger appropriately will improve Outcome: Progressing

## 2023-09-21 NOTE — Progress Notes (Signed)
   09/21/23 2256  Psych Admission Type (Psych Patients Only)  Admission Status Voluntary  Psychosocial Assessment  Patient Complaints None  Eye Contact Fair  Facial Expression Flat  Affect Appropriate to circumstance  Speech Soft  Interaction Assertive  Motor Activity Other (Comment) (wnl)  Appearance/Hygiene Unremarkable  Behavior Characteristics Appropriate to situation;Cooperative;Calm  Mood Pleasant  Thought Process  Coherency Circumstantial  Content WDL  Delusions None reported or observed  Perception WDL  Hallucination None reported or observed  Judgment Poor  Confusion None  Danger to Self  Current suicidal ideation? Denies  Danger to Others  Danger to Others None reported or observed

## 2023-09-21 NOTE — Progress Notes (Signed)
Pt completed packet from [facility], CSW faxed packet. Pt reported she is unsure as to whether or not she will be accepted due to drug usage being marijuana. " I just need shelter".   CSW contacted [facility] who reported they are waiting on authorization from insurance and will have an answer Monday.   Pt made aware.

## 2023-09-21 NOTE — Progress Notes (Signed)
   09/21/23 0900  Psych Admission Type (Psych Patients Only)  Admission Status Voluntary  Psychosocial Assessment  Patient Complaints None  Eye Contact Fair  Facial Expression Anxious  Affect Appropriate to circumstance  Speech Logical/coherent  Interaction Assertive  Motor Activity Other (Comment) (Unremarkable.)  Appearance/Hygiene Unremarkable  Behavior Characteristics Cooperative;Appropriate to situation  Mood Anxious  Thought Process  Coherency Circumstantial  Content WDL  Delusions None reported or observed  Perception WDL  Hallucination None reported or observed  Judgment Limited  Confusion None  Danger to Self  Current suicidal ideation? Denies  Description of Agreement Verbal  Danger to Others  Danger to Others None reported or observed

## 2023-09-21 NOTE — Plan of Care (Signed)
  Problem: Education: Goal: Mental status will improve Outcome: Progressing   

## 2023-09-22 DIAGNOSIS — F332 Major depressive disorder, recurrent severe without psychotic features: Secondary | ICD-10-CM

## 2023-09-22 MED ORDER — DOXYCYCLINE HYCLATE 100 MG PO TABS: 100.0000 mg | ORAL_TABLET | Freq: Two times a day (BID) | ORAL | 0 refills | Status: AC

## 2023-09-22 MED ORDER — LAMOTRIGINE 25 MG PO TABS: 25.0000 mg | ORAL_TABLET | Freq: Every day | ORAL | 0 refills | Status: AC

## 2023-09-22 MED ORDER — NICOTINE POLACRILEX 2 MG MT GUM: 2.0000 mg | CHEWING_GUM | OROMUCOSAL | 0 refills | Status: AC | PRN

## 2023-09-22 MED ORDER — SENNA 8.6 MG PO TABS: 1.0000 | ORAL_TABLET | Freq: Every evening | ORAL | Status: AC | PRN

## 2023-09-22 MED ORDER — POLYETHYLENE GLYCOL 3350 17 G PO PACK: 17.0000 g | PACK | Freq: Every day | ORAL | Status: AC | PRN

## 2023-09-22 MED ORDER — MELATONIN 3 MG PO TABS: 3.0000 mg | ORAL_TABLET | Freq: Every evening | ORAL | Status: AC | PRN

## 2023-09-22 MED ORDER — FLUOXETINE HCL 20 MG PO CAPS: 20.0000 mg | ORAL_CAPSULE | Freq: Every day | ORAL | 0 refills | Status: AC

## 2023-09-22 NOTE — Group Note (Signed)
Date:  09/22/2023 Time:  9:11 AM  Group Topic/Focus:  Goals Group:   The focus of this group is to help patients establish daily goals to achieve during treatment and discuss how the patient can incorporate goal setting into their daily lives to aide in recovery. Orientation:   The focus of this group is to educate the patient on the purpose and policies of crisis stabilization and provide a format to answer questions about their admission.  The group details unit policies and expectations of patients while admitted.    Participation Level:  Active  Participation Quality:  Appropriate  Affect:  Appropriate  Cognitive:  Appropriate  Insight: Appropriate  Engagement in Group:  Engaged  Modes of Intervention:  Discussion  Additional Comments:    Takayla Baillie D Ledell Codrington 09/22/2023, 9:11 AM

## 2023-09-22 NOTE — Discharge Instructions (Signed)

## 2023-09-22 NOTE — [facility] Suicide Risk Assessment (Signed)
Suicide Risk Assessment  Discharge Assessment    [facility] 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   Cannabis use disorder, severe, dependence (HCC)   Tobacco use disorder, moderate, dependence   Total Time spent with patient: 45 minutes  Susan Graves is a 27 y.o., female with a past psychiatric history of unspecified depression, GAD, x1 prior psychiatric hospitalizations (due to depression after abusing susbtances), x1 prior psychiatric urgent care / emergency department visits (at [facility] for SI) and substance use history of cannabis and tobacco use disorder, stimulant use disorder in sustained remission  who presents to the [facility] Voluntary from the community for evaluation and management of suicidal ideations secondary to worsening depression.   During the patient's hospitalization, patient had extensive initial psychiatric evaluation, and follow-up psychiatric evaluations every day.  Psychiatric diagnoses provided upon initial assessment:  MDD, recurrent, severe, r/o bipolar II disorder, r/o substance-induced mood disorder Cannabis use disorder, severe Tobacco use disorder, severe Stimulant use disorder, severe, in sustained remission R/o BPD  Patient's psychiatric medications were adjusted on admission:  Start fluoxetine 20 mg daily for depressive symptoms Start lamotrigine 25 mg daily at bedtime for mood stabilization, can continue up titration in outpatient setting  During the hospitalization, other adjustments were made to the patient's psychiatric medication regimen:  Patient's fluoxetine 20 mg was titrated to 40 mg and later decreased back down to 20 mg by discharge due to subjective reports of worsening  dry mouth Continued lamotrigine 25 mg nightly  Patient's care was discussed during the interdisciplinary team meeting every day during the hospitalization.  The patient denies having side effects to prescribed psychiatric medication.  Gradually, patient started adjusting to milieu. The patient was evaluated each day by a clinical provider to ascertain response to treatment. Improvement was noted by the patient's report of decreasing symptoms, improved sleep and appetite, affect, medication tolerance, behavior, and participation in unit programming.  Patient was asked each day to complete a self inventory noting mood, mental status, pain, new symptoms, anxiety and concerns.    Symptoms were reported as significantly decreased or resolved completely by discharge.   On day of discharge, the patient reports that their mood is stable. The patient denied having suicidal thoughts for more than 48 hours prior to discharge.  Patient denies having homicidal thoughts.  Patient denies having auditory hallucinations.  Patient denies any visual hallucinations or other symptoms of psychosis. The patient was motivated to continue taking medication with a goal of continued improvement in mental health.   The patient reports their target psychiatric symptoms of depression and impulsivity all responded well to the psychiatric medications, and the patient reports overall benefit other psychiatric hospitalization. Supportive psychotherapy was provided to the patient. The patient also participated in regular group therapy while hospitalized. Coping skills, problem solving as well as relaxation therapies were also part of the unit programming.  Labs were reviewed with the patient, and abnormal results were discussed with the patient.  The patient is able to verbalize their individual safety plan to this provider.  # It is recommended to the patient to continue psychiatric medications as prescribed, after discharge from  the hospital.    # It is recommended to the patient to follow up with your outpatient psychiatric provider and PCP.  # It was discussed  with the patient, the impact of alcohol, drugs, tobacco have been there overall psychiatric and medical wellbeing, and total abstinence from substance use was recommended the patient.ed.  # Prescriptions provided or sent directly to preferred pharmacy at discharge. Patient agreeable to plan. Given opportunity to ask questions. Appears to feel comfortable with discharge.    # In the event of worsening symptoms, the patient is instructed to call the crisis hotline, 911 and or go to the nearest ED for appropriate evaluation and treatment of symptoms. To follow-up with primary care provider for other medical issues, concerns and or health care needs  # Patient was discharged Home with mother with a plan to follow up as noted below.    Psychiatric Specialty Exam: General Appearance:  Appropriate for Environment    Eye Contact:  Minimal    Speech:  Normal Rate; Clear and Coherent    Volume:  Normal    Mood:  "Ok"    Affect:  Less irritable, congruent, full range    Thought Content:  Logical    Suicidal Thoughts:  Suicidal Thoughts: No      Homicidal Thoughts:  Homicidal Thoughts: No      Thought Process:  Coherent (concrete)    Orientation:  Grossly intact      Memory:  Immediate Good    Judgment:  Fair    Insight:  Fair    Concentration:  Fair    Recall:  Fair    Fund of Knowledge:  Good    Language:  Good    Psychomotor Activity:  Psychomotor Activity: Decreased      Assets:  Resilience    Sleep:  Fair           Physical Exam: Physical Exam Vitals and nursing note reviewed.  HENT:     Head: Normocephalic and atraumatic.  Eyes:     Conjunctiva/sclera: Conjunctivae normal.  Pulmonary:     Effort: Pulmonary effort is normal. No respiratory distress.  Skin:    General: Skin is warm and dry.     Review of Systems  All other systems reviewed and are negative.  Blood pressure 106/80, pulse (!) 104, temperature 97.9 F (36.6 C), temperature source Oral, resp. rate 18, height 5\' 7"  (1.702 m), weight 76.7 kg, last menstrual period 09/11/2023, SpO2 98%. Body mass index is 26.47 kg/m.  Mental Status Per Nursing Assessment::   On Admission:  Self-harm behaviors, Self-harm thoughts, Intention to act on suicide plan  Demographic factors:  Gay, lesbian, or bisexual orientation Current Mental Status:  Self-harm behaviors, Self-harm thoughts, Intention to act on suicide plan Loss Factors:  Loss of significant relationship Historical Factors:  Prior suicide attempts, Impulsivity Risk Reduction Factors:  NA   Continued Clinical Symptoms:  More than one psychiatric diagnosis Unstable or Poor Therapeutic Relationship Previous Psychiatric Diagnoses and Treatments  Cognitive Features That Contribute To Risk:  None    Suicide Risk:  Mild: There are no identifiable suicide plans, no associated intent, mild dysphoria and related symptoms, good self-control (both objective and subjective assessment), few other risk factors, and identifiable protective factors, including available and accessible social support.    Follow-up Information     [facility] Go on 09/30/2023.   Specialty: Professional Counselor Why: Please go to this provider on 09/30/23 at 9:00 am for therapy services.  You may also go on Monday through Friday, from 9 am to 1 pm for new patients. Contact information: [address] [place] [address]  [phone]         [facility]. Go on 10/01/2023.   Specialty: Behavioral Health Why: Please go to this provider on 10/01/23 at 7:00 am for medication management services.  For fastest service, you may also go on Monday through Friday, arrive by 7:00 am for same day service. Contact information: [address] [place] [address] [phone]                Plan Of Care/Follow-up recommendations:  Activity: as tolerated  Diet: heart healthy  Other: -Follow-up with your outpatient psychiatric provider -instructions on appointment date, time, and address (location) are provided to you in discharge paperwork.  -Take your psychiatric medications as prescribed at discharge - instructions are provided to you in the discharge paperwork  -Follow-up with outpatient primary care doctor and other specialists -for management of preventative medicine and chronic medical disease, including:  None  -Testing: Follow-up with outpatient provider for abnormal lab results:  #STI screening positive for Chlamydia Started 7-day course of doxycycline 100 mg every 12 hours (09/19/2023-09/26/2023)  -Recommend abstinence from alcohol, tobacco, and other illicit drug use at discharge.   -If your psychiatric symptoms recur, worsen, or if you have side effects to your psychiatric medications, call your outpatient psychiatric provider, 911, [phone] or go to the nearest emergency department.  -If suicidal thoughts recur, call your outpatient psychiatric provider, 911, [phone] or go to the nearest emergency department.     Lorri Frederick, MD 09/22/2023, 8:00 AM

## 2023-09-22 NOTE — [facility] Suicide Risk Assessment (Signed)
[facility] INPATIENT:  Family/Significant Other Suicide Prevention Education  Suicide Prevention Education:  Education Completed; Arville Go,  (Mother [phone]) has been identified by the patient as the family member/significant other with whom the patient will be residing, and identified as the person(s) who will aid the patient in the event of a mental health crisis (suicidal ideations/suicide attempt).  With written consent from the patient, the family member/significant other has been provided the following suicide prevention education, prior to the and/or following the discharge of the patient.  The suicide prevention education provided includes the following: Suicide risk factors Suicide prevention and interventions National Suicide Hotline telephone number [facility] assessment telephone number [facility] Emergency Assistance 911 [facility] and/or Residential Mobile Crisis Unit telephone number  Request made of family/significant other to: Remove weapons (e.g., guns, rifles, knives), all items previously/currently identified as safety concern.   Remove drugs/medications (over-the-counter, prescriptions, illicit drugs), all items previously/currently identified as a safety concern.  The family member/significant other verbalizes understanding of the suicide prevention education information provided.  The family member/significant other agrees to remove the items of safety concern listed above.   CSW spoke to mom, mom stated that she will be picking her up and than later will be going to the [facility].   Susan Graves 09/22/2023, 9:10 AM

## 2023-09-22 NOTE — Plan of Care (Signed)
  Problem: Education: Goal: Knowledge of [facility] Education information/materials will improve Outcome: Completed/Met Goal: Emotional status will improve Outcome: Completed/Met Goal: Mental status will improve Outcome: Completed/Met Goal: Verbalization of understanding the information provided will improve Outcome: Completed/Met   Problem: Activity: Goal: Interest or engagement in activities will improve Outcome: Completed/Met Goal: Sleeping patterns will improve Outcome: Completed/Met   Problem: Coping: Goal: Ability to verbalize frustrations and anger appropriately will improve Outcome: Completed/Met Goal: Ability to demonstrate self-control will improve Outcome: Completed/Met   Problem: Health Behavior/Discharge Planning: Goal: Identification of resources available to assist in meeting health care needs will improve Outcome: Completed/Met Goal: Compliance with treatment plan for underlying cause of condition will improve Outcome: Completed/Met

## 2023-09-22 NOTE — Progress Notes (Signed)
Patient verbalizes readiness for discharge. All patient belongings returned to patient. Discharge instructions read and discussed with patient (appointments, medications, resources). Patient expressed gratitude for care provided. Patient discharged to lobby at [phone] where ride was waiting.

## 2023-09-22 NOTE — Progress Notes (Signed)
  [facility] Adult Case Management Discharge Plan :  Will you be returning to the same living situation after discharge:  Yes,  Patient will be going to mom's house At discharge, do you have transportation home?: Yes,  Patient's mom will be picking her up.  Do you have the ability to pay for your medications: Yes,  Patient stated that she has insurance she will find a way  Release of information consent forms completed and in the chart;  Patient's signature needed at discharge.  Patient to Follow up at:  Follow-up Information     [facility] Go on 09/30/2023.   Specialty: Professional Counselor Why: Please go to this provider on 09/30/23 at 9:00 am for therapy services.  You may also go on Monday through Friday, from 9 am to 1 pm for new patients. Contact information: [address] [place] [address] [phone]         [facility]. Go on 10/01/2023.   Specialty: Behavioral Health Why: Please go to this provider on 10/01/23 at 7:00 am for medication management services.  For fastest service, you may also go on Monday through Friday, arrive by 7:00 am for same day service. Contact information: [address] [place] [address] [phone]                Next level of care provider has access to [facility]:no  Safety Planning and Suicide Prevention discussed: Yes,  CSW spoke called and spoke to mom Gardiner Ramus on 09/22/2023 @ 910am     Has patient been referred to the Quitline?: Patient refused referral for treatment  Patient has been referred for addiction treatment: Patient refused referral for treatment; referral information given to patient at discharge.  Rachel Samples O Shreshta Medley, LCSWA 09/22/2023, 9:12 AM
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
## 2023-09-26 ENCOUNTER — Other Ambulatory Visit: Payer: Self-pay

## 2023-09-26 ENCOUNTER — Ambulatory Visit: Payer: Self-pay | Admitting: Gerontology

## 2023-09-26 ENCOUNTER — Encounter: Payer: Self-pay | Admitting: Gerontology

## 2023-09-26 VITALS — BP 97/66 | HR 96 | Wt 173.7 lb

## 2023-09-26 DIAGNOSIS — B028 Zoster with other complications: Secondary | ICD-10-CM | POA: Insufficient documentation

## 2023-09-26 DIAGNOSIS — Z7689 Persons encountering health services in other specified circumstances: Secondary | ICD-10-CM | POA: Insufficient documentation

## 2023-09-26 DIAGNOSIS — G8929 Other chronic pain: Secondary | ICD-10-CM | POA: Insufficient documentation

## 2023-09-26 DIAGNOSIS — M79604 Pain in right leg: Secondary | ICD-10-CM | POA: Insufficient documentation

## 2023-09-26 DIAGNOSIS — B001 Herpesviral vesicular dermatitis: Secondary | ICD-10-CM | POA: Insufficient documentation

## 2023-09-26 MED ORDER — LIDOCAINE 5 % EX OINT
1.0000 | TOPICAL_OINTMENT | CUTANEOUS | 0 refills | Status: AC | PRN
  Filled 2023-09-26: qty 50, 30d supply, fill #0

## 2023-09-26 MED ORDER — VALACYCLOVIR HCL 1 G PO TABS: 1000.0000 mg | ORAL_TABLET | Freq: Every day | ORAL | 0 refills | Status: DC

## 2023-09-26 MED ORDER — VALACYCLOVIR HCL 1 G PO TABS
1000.0000 mg | ORAL_TABLET | Freq: Every day | ORAL | 0 refills | Status: AC
  Filled 2023-09-26: qty 5, 5d supply, fill #0

## 2023-09-26 MED ORDER — ACETAMINOPHEN 500 MG PO TABS
500.0000 mg | ORAL_TABLET | Freq: Two times a day (BID) | ORAL | 0 refills | Status: AC
  Filled 2023-09-26: qty 60, 30d supply, fill #0
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
## 2023-09-27 ENCOUNTER — Other Ambulatory Visit: Payer: Self-pay

## 2023-09-27 ENCOUNTER — Ambulatory Visit: Payer: Medicaid Other

## 2023-09-30 ENCOUNTER — Other Ambulatory Visit: Payer: Self-pay

## 2023-10-18 DIAGNOSIS — F3189 Other bipolar disorder: Secondary | ICD-10-CM | POA: Diagnosis not present

## 2023-10-22 ENCOUNTER — Ambulatory Visit: Payer: Medicaid Other | Admitting: Rheumatology

## 2023-10-22 ENCOUNTER — Ambulatory Visit: Payer: Self-pay | Admitting: Gerontology

## 2023-10-23 DIAGNOSIS — M25552 Pain in left hip: Secondary | ICD-10-CM | POA: Diagnosis not present

## 2023-10-23 DIAGNOSIS — M7051 Other bursitis of knee, right knee: Secondary | ICD-10-CM | POA: Diagnosis not present

## 2023-10-30 DIAGNOSIS — M129 Arthropathy, unspecified: Secondary | ICD-10-CM | POA: Diagnosis not present

## 2023-10-30 DIAGNOSIS — M25552 Pain in left hip: Secondary | ICD-10-CM | POA: Diagnosis not present

## 2023-11-04 DIAGNOSIS — M7051 Other bursitis of knee, right knee: Secondary | ICD-10-CM | POA: Diagnosis not present

## 2023-11-04 DIAGNOSIS — M1612 Unilateral primary osteoarthritis, left hip: Secondary | ICD-10-CM | POA: Diagnosis not present

## 2023-11-13 DIAGNOSIS — Z1331 Encounter for screening for depression: Secondary | ICD-10-CM | POA: Diagnosis not present

## 2023-11-13 DIAGNOSIS — Z1389 Encounter for screening for other disorder: Secondary | ICD-10-CM | POA: Diagnosis not present

## 2023-11-13 DIAGNOSIS — Z124 Encounter for screening for malignant neoplasm of cervix: Secondary | ICD-10-CM | POA: Diagnosis not present

## 2023-11-13 DIAGNOSIS — Z113 Encounter for screening for infections with a predominantly sexual mode of transmission: Secondary | ICD-10-CM | POA: Diagnosis not present

## 2023-11-13 DIAGNOSIS — M25552 Pain in left hip: Secondary | ICD-10-CM | POA: Diagnosis not present

## 2023-11-14 DIAGNOSIS — K529 Noninfective gastroenteritis and colitis, unspecified: Secondary | ICD-10-CM | POA: Diagnosis not present

## 2023-11-15 DIAGNOSIS — F3189 Other bipolar disorder: Secondary | ICD-10-CM | POA: Diagnosis not present

## 2023-11-19 DIAGNOSIS — Z1159 Encounter for screening for other viral diseases: Secondary | ICD-10-CM | POA: Diagnosis not present

## 2023-11-19 DIAGNOSIS — Z13220 Encounter for screening for lipoid disorders: Secondary | ICD-10-CM | POA: Diagnosis not present

## 2023-11-19 DIAGNOSIS — Z113 Encounter for screening for infections with a predominantly sexual mode of transmission: Secondary | ICD-10-CM | POA: Diagnosis not present

## 2023-11-19 DIAGNOSIS — Z131 Encounter for screening for diabetes mellitus: Secondary | ICD-10-CM | POA: Diagnosis not present

## 2023-11-26 ENCOUNTER — Telehealth: Payer: Self-pay

## 2023-11-26 NOTE — Telephone Encounter (Signed)
Received medical record release from disability. Printed and faxed.

## 2023-11-27 DIAGNOSIS — Z86718 Personal history of other venous thrombosis and embolism: Secondary | ICD-10-CM | POA: Diagnosis not present

## 2023-11-27 DIAGNOSIS — A599 Trichomoniasis, unspecified: Secondary | ICD-10-CM | POA: Diagnosis not present

## 2023-11-27 DIAGNOSIS — K625 Hemorrhage of anus and rectum: Secondary | ICD-10-CM | POA: Diagnosis not present

## 2023-11-27 DIAGNOSIS — Z1389 Encounter for screening for other disorder: Secondary | ICD-10-CM | POA: Diagnosis not present

## 2023-11-27 DIAGNOSIS — R7309 Other abnormal glucose: Secondary | ICD-10-CM | POA: Diagnosis not present

## 2023-12-10 DIAGNOSIS — T84120A Displacement of internal fixation device of right humerus, initial encounter: Secondary | ICD-10-CM | POA: Diagnosis not present

## 2023-12-12 DIAGNOSIS — H5213 Myopia, bilateral: Secondary | ICD-10-CM | POA: Diagnosis not present

## 2023-12-25 ENCOUNTER — Ambulatory Visit: Payer: Self-pay | Admitting: Gerontology

## 2024-03-25 ENCOUNTER — Other Ambulatory Visit: Payer: Self-pay

## 2024-03-25 ENCOUNTER — Emergency Department

## 2024-03-25 ENCOUNTER — Emergency Department
Admission: EM | Admit: 2024-03-25 | Discharge: 2024-03-25 | Disposition: A | Discharge status: Home / Self Care | Attending: Emergency Medicine | Admitting: Emergency Medicine

## 2024-03-25 DIAGNOSIS — J029 Acute pharyngitis, unspecified: Secondary | ICD-10-CM | POA: Diagnosis not present

## 2024-03-25 DIAGNOSIS — R059 Cough, unspecified: Secondary | ICD-10-CM | POA: Diagnosis not present

## 2024-03-25 DIAGNOSIS — J069 Acute upper respiratory infection, unspecified: Secondary | ICD-10-CM | POA: Diagnosis not present

## 2024-03-25 LAB — RESP PANEL BY RT-PCR (RSV, FLU A&B, COVID)  RVPGX2
Influenza A by PCR: NEGATIVE
Influenza B by PCR: NEGATIVE
Resp Syncytial Virus by PCR: NEGATIVE
SARS Coronavirus 2 by RT PCR: NEGATIVE

## 2024-03-25 LAB — GROUP A STREP BY PCR: Group A Strep by PCR: NOT DETECTED

## 2024-03-25 MED ORDER — IPRATROPIUM-ALBUTEROL 0.5-2.5 (3) MG/3ML IN SOLN: 3.0000 mL | Freq: Once | RESPIRATORY_TRACT | Status: DC

## 2024-03-25 NOTE — ED Triage Notes (Signed)
 Pt to ED via POV from home. Pt repors cough, sneezing, sore throat and congestion x2 days. Pt reports sick contact with friend.

## 2024-03-25 NOTE — ED Notes (Signed)
 Patient transported to X-ray

## 2024-03-25 NOTE — Discharge Instructions (Addendum)
 Your COVID/flu/RSV and strep swabs were negative.  Your chest x-ray was negative.  Your symptoms are most consistent with a viral upper respiratory tract infection.  You may take over-the-counter medications to help with your symptoms.  Please return for any new, worsening, or change in symptoms or other concerns.  It was a pleasure caring for you today.
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
## 2024-04-15 DIAGNOSIS — Z1389 Encounter for screening for other disorder: Secondary | ICD-10-CM | POA: Diagnosis not present

## 2024-04-15 DIAGNOSIS — Z113 Encounter for screening for infections with a predominantly sexual mode of transmission: Secondary | ICD-10-CM | POA: Diagnosis not present

## 2024-04-15 DIAGNOSIS — Z1159 Encounter for screening for other viral diseases: Secondary | ICD-10-CM | POA: Diagnosis not present

## 2024-04-15 DIAGNOSIS — N760 Acute vaginitis: Secondary | ICD-10-CM | POA: Diagnosis not present

## 2024-05-23 ENCOUNTER — Emergency Department
Admission: EM | Admit: 2024-05-23 | Discharge: 2024-05-23 | Discharge status: Against Medical Advice | Attending: Emergency Medicine | Admitting: Emergency Medicine

## 2024-05-23 ENCOUNTER — Other Ambulatory Visit: Payer: Self-pay

## 2024-05-23 ENCOUNTER — Emergency Department

## 2024-05-23 DIAGNOSIS — Z5321 Procedure and treatment not carried out due to patient leaving prior to being seen by health care provider: Secondary | ICD-10-CM | POA: Insufficient documentation

## 2024-05-23 DIAGNOSIS — R5383 Other fatigue: Secondary | ICD-10-CM | POA: Insufficient documentation

## 2024-05-23 DIAGNOSIS — R531 Weakness: Secondary | ICD-10-CM | POA: Diagnosis not present

## 2024-05-23 DIAGNOSIS — R0789 Other chest pain: Secondary | ICD-10-CM | POA: Insufficient documentation

## 2024-05-23 NOTE — ED Notes (Signed)
 Pt refuses covid flu RSV swab.

## 2024-05-23 NOTE — ED Triage Notes (Signed)
 Pt to ED for weakness, fatigue and chest tightness since 30 minutes ago while at work. Denies cough and SOB.

## 2024-05-23 NOTE — ED Notes (Signed)
 When was about to do bloodwork then EKG, pt states nevermind, I think it's gas and walked out. Pt refuses bloodwork, EKG and medical screening exam. Pt ambulatory.

## 2024-09-10 ENCOUNTER — Emergency Department
Admission: EM | Admit: 2024-09-10 | Discharge: 2024-09-10 | Disposition: A | Discharge status: Home / Self Care | Attending: Emergency Medicine | Admitting: Emergency Medicine

## 2024-09-10 ENCOUNTER — Other Ambulatory Visit: Payer: Self-pay

## 2024-09-10 DIAGNOSIS — Z630 Problems in relationship with spouse or partner: Secondary | ICD-10-CM | POA: Diagnosis present

## 2024-09-10 DIAGNOSIS — Z046 Encounter for general psychiatric examination, requested by authority: Secondary | ICD-10-CM | POA: Diagnosis not present

## 2024-09-10 DIAGNOSIS — F4320 Adjustment disorder, unspecified: Secondary | ICD-10-CM | POA: Insufficient documentation

## 2024-09-10 LAB — CBC
HCT: 35.9 % — ABNORMAL LOW (ref 36.0–46.0)
Hemoglobin: 11.2 g/dL — ABNORMAL LOW (ref 12.0–15.0)
MCH: 23.6 pg — ABNORMAL LOW (ref 26.0–34.0)
MCHC: 31.2 g/dL (ref 30.0–36.0)
MCV: 75.6 fL — ABNORMAL LOW (ref 80.0–100.0)
Platelets: 317 K/uL (ref 150–400)
RBC: 4.75 MIL/uL (ref 3.87–5.11)
RDW: 16.1 % — ABNORMAL HIGH (ref 11.5–15.5)
WBC: 4.8 K/uL (ref 4.0–10.5)
nRBC: 0 % (ref 0.0–0.2)

## 2024-09-10 LAB — URINE DRUG SCREEN
Amphetamines: NEGATIVE
Barbiturates: NEGATIVE
Benzodiazepines: NEGATIVE
Cocaine: NEGATIVE
Fentanyl: NEGATIVE
Methadone Scn, Ur: NEGATIVE
Opiates: NEGATIVE
Tetrahydrocannabinol: POSITIVE — AB

## 2024-09-10 LAB — COMPREHENSIVE METABOLIC PANEL WITH GFR
ALT: 19 U/L (ref 0–44)
AST: 22 U/L (ref 15–41)
Albumin: 4.7 g/dL (ref 3.5–5.0)
Alkaline Phosphatase: 61 U/L (ref 38–126)
Anion gap: 14 (ref 5–15)
BUN: <5 mg/dL — ABNORMAL LOW (ref 6–20)
CO2: 19 mmol/L — ABNORMAL LOW (ref 22–32)
Calcium: 9.3 mg/dL (ref 8.9–10.3)
Chloride: 107 mmol/L (ref 98–111)
Creatinine, Ser: 0.68 mg/dL (ref 0.44–1.00)
GFR, Estimated: >60 mL/min (ref >60)
Glucose, Bld: 97 mg/dL (ref 70–99)
Potassium: 3.7 mmol/L (ref 3.5–5.1)
Sodium: 140 mmol/L (ref 135–145)
Total Bilirubin: 0.2 mg/dL (ref 0.0–1.2)
Total Protein: 7.6 g/dL (ref 6.5–8.1)

## 2024-09-10 LAB — POC URINE PREG, ED: Preg Test, Ur: NEGATIVE

## 2024-09-10 LAB — ETHANOL: Alcohol, Ethyl (B): 44 mg/dL — ABNORMAL HIGH (ref <15)

## 2024-09-10 NOTE — Consult Note (Signed)
 [facility] Psychiatric Consult Initial  Patient Name: .Susan Graves  MRN: [id number]  DOB: 09-13-1996  Consult Order details:  Orders (From admission, onward)     Start     Ordered   09/10/24 1520  IP CONSULT TO PSYCHIATRY       Ordering Provider: Floy Roberts, MD  Provider:  (Not yet assigned)  Question Answer Comment  Reason for consult: Other (see comments)   Comments: IVC      09/10/24 1520   09/10/24 1520  CONSULT TO CALL ACT TEAM       Ordering Provider: Floy Roberts, MD  Provider:  (Not yet assigned)  Question:  Reason for Consult?  Answer:  IVC   09/10/24 1520             Mode of Visit: In person    Psychiatry Consult Evaluation  Service Date: September 10, 2024 LOS:  LOS: 0 days  Chief Complaint We were arguing   Primary Psychiatric Diagnoses   Relationship problem between partners   Assessment   Susan Graves is a 28 y.o. female admitted: Presented to the EDfor 09/10/2024  2:36 PM for Per IVC paperwork the concern was for suicidal ideation and homicidal ideation.  The patient herself denies any SI or HI.  She says that she got into an argument with her girlfriend who took out the paperwork.  She does admit to self-harm although states that was in the past.  Denies any recent self-harm.  Denies any medical complaints. . She carries the psychiatric diagnoses of ADHD, history of self-harm and has a past medical history of  none noted.     On assessment today, patient adamantly denied any suicidal or homicidal ideations.  Patient denied any auditory or visual hallucinations as well.  Patient did report remote history of self-harm and was very forthcoming about incidents that occurred around 1 year ago.  Patient reported a year ago she engaged in self-harm behaviors including cutting her neck and forearms.  She reported no self-harm behavior since then.  On exam, there were no new lacerations or evidence of new cutting.  Per ED staff, patient has been  calm and cooperative throughout this process and displayed no self-harm or aggressive behaviors.  Patient was agreeable to psychiatry team providing outpatient mental health resources.  Patient did report previously diagnosed with ADHD.  Patient reported [facility], but reported unable to due to insurance issues at the time.  Patient gave permission for mother Talbert to be contacted for collateral information.  Mother voiced no safety concerns that patient would harm herself or anyone else.  Mother reported that patient's significant other is conniving.  Mother reported she believes a significant other did this due to her and the patient getting into a fight.  She reported that patient significant other often displays vindictive behaviors when they get into arguments.  She also believes that patient significant other uses her previous mental health against her.  Mother reported that patient has displayed no recent self-harm behaviors and has been doing well.  Again, mother voiced no concerns that patient was a threat to herself or others.  At this time, IVC will be rescinded as patient does not currently meet IVC criteria.On current presentation there was no evidence of psychosis or mania and patient did not appear to be responding to internal stimuli. At this time, patient does not appear to be a risk to self or others.While future psychiatric events cannot be accurately predicted,  the patient does not currently require acute inpatient psychiatric care and does not currently meet [place]  involuntary commitment criteria.   Diagnoses:  Active Hospital problems: Active Problems:   Relationship problem between partners    Plan   ## Psychiatric Medication Recommendations:  Will defer to outpatient management  ## Medical Decision Making Capacity: Not specifically addressed in this encounter  ## Further Work-up:   -- Pertinent labwork reviewed earlier this admission  includes: CMP, ethanol, CBC, urine drug screen   ## Disposition:-- There are no psychiatric contraindications to discharge at this time  ## Behavioral / Environmental: - No specific recommendations at this time.     ## Safety and Observation Level:  - Based on my clinical evaluation, I estimate the patient to be at low risk of self harm in the current setting. - At this time, we recommend  routine. This decision is based on my review of the chart including patient's history and current presentation, interview of the patient, mental status examination, and consideration of suicide risk including evaluating suicidal ideation, plan, intent, suicidal or self-harm behaviors, risk factors, and protective factors. This judgment is based on our ability to directly address suicide risk, implement suicide prevention strategies, and develop a safety plan while the patient is in the clinical setting. Please contact our team if there is a concern that risk level has changed.  CSSR Risk Category:C-SSRS RISK CATEGORY: Moderate Risk-noted that patient's risk will always be elevated on these wrist scales due to previous self-harm attempts that occurred over a year ago  Suicide Risk Assessment: Patient has following modifiable risk factors for suicide: triggering events, which we are addressing by utilizing therapeutic communication to provide safe space for patient to be able to discuss concerns and discussing therapeutic coping mechanisms. Patient has following non-modifiable or demographic risk factors for suicide: history of self harm behavior and psychiatric hospitalization Patient has the following protective factors against suicide: Access to outpatient mental health care and Supportive family  Thank you for this consult request. Recommendations have been communicated to the primary team.  We will sign off at this time.   Zelda Sharps, NP        History of Present Illness  Relevant Aspects of Hospital  ED   Patient Report:  On assessment today, patient adamantly denied any suicidal or homicidal ideations.  Patient denied any auditory or visual hallucinations as well.  Patient did report remote history of self-harm and was very forthcoming about incidents that occurred around 1 year ago.  Patient reported a year ago she engaged in self-harm behaviors including cutting her neck and forearms.  She reported no self-harm behavior since then.  On exam, there was no evidence of new lacerations or evidence of new cutting. Previous scars were noted and consistent with patient story. Per ED staff, patient has been calm and cooperative throughout this process and displayed no self-harm or aggressive behaviors.  Patient was agreeable to psychiatry team providing outpatient mental health resources.  Patient did report previously diagnosed with ADHD.  Patient reported attempting to [facility]up at [facility], but reported unable to due to insurance issues at the time.  Patient gave permission for mother Talbert to be contacted for collateral information.  Mother voiced no safety concerns that patient would harm herself or anyone else.  Mother reported that patient's significant other is conniving.  Mother reported she believes patient's significant other did this due to her and the patient getting into a fight.  She reported that  patient's significant other often displays vindictive behaviors when they get into arguments.  She also believes that patient significant other uses her previous mental health against her.  Mother reported that patient has displayed no recent self-harm behaviors and has been doing well.  Again, mother voiced no concerns that patient was a threat to herself or others.  At this time, IVC will be rescinded as patient does not currently meet IVC criteria.On current presentation there was no evidence of psychosis or mania and patient did not appear to be responding to internal stimuli. At this time, patient  does not appear to be a risk to self or others.While future psychiatric events cannot be accurately predicted, the patient does not currently require acute inpatient psychiatric care and does not currently meet [place]  involuntary commitment criteria.  TTS was present with this provider during duration of exam where the following information was collected: Perri Aragones is a 28 year old female who presents to the ER under IVC, following an argument with her partner. Patient states she and her partner broke up. When she left the home, she would not give her the location to where she was. The partner, contacted law enforcement and expressed concerns that she was going to harm herself. Patient states she has self-harming behaviors in the past but nothing in over a year. She was vocal about how the girlfriend was not telling the truth and this was her way of getting back at her. During the interview, the patient was tearful and upset because she was brought to the ER, "over a lie." She also reports she does not want to harm her self or anyone else. I want to live. I don't want to kill myself. I'm good. I'm not about that dumb shit no more. She denies AV/H and denies the use of any mind-altering substances. Yea, I give up that dumb shit (smoking THC) for real. Patient voice concern that she would not be discharged and missed her first day of work tomorrow. She starts at [employer].   Per the report of the patient's mother Stafford [phone]), the patient and her partner have been arguing for approximately three days. Law enforcement have been contacted several times. Once at the grandmother's home, where they were living. As well as at the mother's house, where the patient currently lives. She also reports, the partner can be conniving and she has no concerns of the patient harming her self or anyone else. She states the patient will be staying with her (mother) upon discharge.  Patient's mother was primarily concerned about her getting back on medications for ADHD. Per the mother, she was able to focus and accomplish more things. Based on the mother's and patient's concern, it's an indication they are both future thinking and planning.  Throughout the entirety of assessment, patient displayed logical, coherent, and forward thinking thought process.  Patient often stated that she enjoyed life and enjoyed living.  Patient reported having goals in life and denied current depression or anxiety.  As stated above, patient was very forthcoming with previous incidents that occurred a year ago.  Patient was also very open about her previous struggles with mental health.  Patient reported previous episodes of self-harm were often related to relationship issues.  Patient did report that she no longer uses cutting as a coping mechanism.  Patient reported attempting to develop healthier coping strategies.  Patient denied threatening to cut herself or to harm anyone else today or recently.  Mother also denied  that patient had done any of the recent behaviors that were listed on the IVC papers.  Of note, patient does have mother and grandmother locally and has a good relationship with them.  Patient will be residing with mother when discharged.  We discussed that it was best if patient took time away from significant other due to current situation to which patient agreed.  Psych ROS:  Depression: Denied Anxiety:  Denied Mania (lifetime and current): Denied Psychosis: (lifetime and current): Denied  Collateral information:  Contacted Lily at number provided by patient on 09/10/2024.    Psychiatric and Social History  Psychiatric History:  Information collected from patient and chart review  Prev Dx/Sx: Previous self-harm Current Psych Provider: Denied-willing to establish with [facility] Home Meds (current): None currently Previous Med Trials: Adderall Therapy: Denied-willing to  establish with [facility]  Prior Psych Hospitalization: Yes Prior Self Harm: Yes Prior Violence: Denied  Family Psych History: Denied Family Hx suicide: Denied  Social History:    Occupational Hx: Starting job at [employer] Hx: Reported previous felony charges, denied any current charges or upcoming court dates Living Situation: With mother Spiritual Hx: Unknown Access to weapons/lethal means: Denied-reported she is legally not able to own or access to firearm due to previous charges  Substance History Alcohol: Endorsed occasional Number of drinks per day does not drink every day History of alcohol withdrawal seizures denied History of DT's denied Tobacco: Vapes daily Illicit drugs: Reported previous THC use Prescription drug abuse: Denied Rehab hx: Denied  Exam Findings  Physical Exam: Reviewed and agree with the physical exam findings conducted by the medical provider Vital Signs:  Temp:  [98.2 F (36.8 C)] 98.2 F (36.8 C) (11/27 1423) Pulse Rate:  [93] 93 (11/27 1423) Resp:  [15] 15 (11/27 1423) BP: (97)/(75) 97/75 (11/27 1423) SpO2:  [100 %] 100 % (11/27 1423) Weight:  [77.6 kg] 77.6 kg (11/27 1428) Blood pressure 97/75, pulse 93, temperature 98.2 F (36.8 C), temperature source Oral, resp. rate 15, height 5' 6 (1.676 m), weight 77.6 kg, SpO2 100%. Body mass index is 27.6 kg/m.    Mental Status Exam: General Appearance: Casual  Orientation:  Full (Time, Place, and Person)  Memory:  Immediate;   Good Recent;   Good Remote;   Good  Concentration:  Concentration: Good  Recall:  Good  Attention  Fair  Eye Contact:  Good  Speech:  Clear and Coherent  Language:  Good  Volume:  Normal  Mood: anxious related to having to be here  Affect:  Congruent  Thought Process:  Coherent, Goal Directed, and Linear  Thought Content:  Logical  Suicidal Thoughts:  No  Homicidal Thoughts:  No  Judgement:  Intact  Insight:  Good  Psychomotor  Activity:  Normal  Akathisia:  No  Fund of Knowledge:  Good      Assets:  Communication Skills Desire for Improvement Financial Resources/Insurance Housing Physical Health Social Support  Cognition:  WNL  ADL's:  Intact  AIMS (if indicated):        Other History   These have been pulled in through the EMR, reviewed, and updated if appropriate.  Family History:  The patient's family history is not on file.  Medical History: Past Medical History:  Diagnosis Date   ADHD    Depression     Surgical History: Past Surgical History:  Procedure Laterality Date   DEBRIDEMENT AND CLOSURE WOUND Left 03/31/2020   Procedure: DEBRIDEMENT AND CLOSURE WOUND;  Surgeon: Kendal Franky SQUIBB, MD;  Location: [facility] OR;  Service: Orthopedics;  Laterality: Left;   ORIF PELVIC FRACTURE WITH PERCUTANEOUS SCREWS Left 03/31/2020   Procedure: ORIF PELVIC FRACTURE WITH PERCUTANEOUS SCREWS;  Surgeon: Kendal Franky SQUIBB, MD;  Location: [facility] OR;  Service: Orthopedics;  Laterality: Left;   TIBIA IM NAIL INSERTION Right 03/31/2020   Procedure: INTRAMEDULLARY (IM) NAIL TIBIAL;  Surgeon: Kendal Franky SQUIBB, MD;  Location: [facility] OR;  Service: Orthopedics;  Laterality: Right;     Medications:  No current facility-administered medications for this encounter.  Current Outpatient Medications:    ascorbic acid (VITAMIN C) 500 MG tablet, Take 500 mg by mouth daily., Disp: , Rfl:    Ferrous Sulfate (IRON) 325 (65 Fe) MG TABS, Take 1 tablet by mouth daily., Disp: , Rfl:    acetaminophen  (TYLENOL ) 500 MG tablet, Take 1 tablet (500 mg total) by mouth 2 (two) times daily., Disp: 60 tablet, Rfl: 0   FLUoxetine  (PROZAC ) 20 MG capsule, Take 1 capsule (20 mg total) by mouth daily., Disp: 30 capsule, Rfl: 0   lamoTRIgine  (LAMICTAL ) 25 MG tablet, Take 1 tablet (25 mg total) by mouth at bedtime., Disp: 30 tablet, Rfl: 0   lidocaine  (XYLOCAINE ) 5 % ointment, Apply 1 Application topically as needed., Disp: 50 g, Rfl: 0   melatonin 3 MG TABS  tablet, Take 1 tablet (3 mg total) by mouth at bedtime as needed., Disp: , Rfl:    nicotine  polacrilex (NICORETTE ) 2 MG gum, Take 1 each (2 mg total) by mouth as needed for smoking cessation., Disp: 100 tablet, Rfl: 0   polyethylene glycol (MIRALAX  / GLYCOLAX ) 17 g packet, Take 17 g by mouth daily as needed for moderate constipation., Disp: , Rfl:    senna (SENOKOT) 8.6 MG TABS tablet, Take 1 tablet (8.6 mg total) by mouth at bedtime as needed for mild constipation., Disp: , Rfl:    valACYclovir  (VALTREX ) 1000 MG tablet, Take 1 tablet (1,000 mg total) by mouth daily., Disp: 5 tablet, Rfl: 0  Allergies: No Known Allergies  Zelda Sharps, NP This note was created using Scientist, clinical (histocompatibility and immunogenetics). Please excuse any inadvertent transcription errors. Case was discussed with supervising physician Dr. Jadapalle who is agreeable with current plan.

## 2024-09-10 NOTE — BH Assessment (Signed)
 Patient provided outpatient referral information for [facility], with her discharge paperwork.

## 2024-09-10 NOTE — BH Assessment (Signed)
 Comprehensive Clinical Assessment (CCA) Screening, Triage and Referral Note  09/10/2024 Susan Graves [id number]  Chief Complaint:  Chief Complaint  Patient presents with   Psychiatric Evaluation   Visit Diagnosis: Adjustment Disorder  Susan Graves is a 28 year old female who presents to the ER under IVC, following an argument with her partner. Patient states she and her partner broke up. When she left the home, she would not give her the location to where she was. The partner, contacted law enforcement and expressed concerns that she was going to harm herself. Patient states she has self-harming behaviors in the past but nothing in over a year. She was vocal about how the girlfriend was not telling the truth and this was her way of getting back at her. During the interview, the patient was tearful and upset because she was brought to the ER, "over a lie." She also reports she does not want to harm her self or anyone else. I want to live. I don't want to kill myself. I'm good. I'm not about that dumb shit no more. She denies AV/H and denies the use of any mind-altering substances. Yea, I give up that dumb shit (smoking THC) for real. Patient voice concern that she would not be discharged and missed her first day of work tomorrow. She starts at [employer].  Per the report of the patient's mother Stafford [phone]), the patient and her partner have been arguing for approximately three days. Law enforcement have been contacted several times. Once at the grandmother's home, where they were living. As well as at the mother's house, where the patient currently lives. She also reports, the partner can be conniving and she has no concerns of the patient harming her self or anyone else. She states the patient will be staying with her (mother) upon discharge. Patient's mother was primarily concerned about her getting back on medications for ADHD. Per the mother, she was able to  focus and accomplish more things. Based on the mother's and patient's concern, it's an indication they are both future thinking and planning.  Patient Reported Information How did you hear about us ? Family/Friend  What Is the Reason for Your Visit/Call Today? Patient placed under IVC, following argument with partner.  How Long Has This Been Causing You Problems? <Week  What Do You Feel Would Help You the Most Today? Treatment for Depression or other mood problem   Have You Recently Had Any Thoughts About Hurting Yourself? No  Are You Planning to Commit Suicide/Harm Yourself At This time? No   Have you Recently Had Thoughts About Hurting Someone [place]? No  Are You Planning to Harm Someone at This Time? No  Explanation: Patient denies that this was a suicide attempt but had earlier told the triage RN that it was.  No HI.   Have You Used Any Alcohol or Drugs in the Past 24 Hours? No  How Long Ago Did You Use Drugs or Alcohol? No data recorded What Did You Use and How Much? No data recorded  Do You Currently Have a Therapist/Psychiatrist? No  Name of Therapist/Psychiatrist: Pt has no outpatient care currently.   Have You Been Recently Discharged From Any Office Practice or Programs? No  Explanation of Discharge From Practice/Program: No recent discharges.    CCA Screening Triage Referral Assessment Type of Contact: Face-to-Face  Telemedicine Service Delivery:   Is this Initial or Reassessment?   Date Telepsych consult ordered in [facility]:    Time Telepsych consult ordered in  [facility]:    Location of Assessment: [facility] ED  Provider Location: [facility] ED    Collateral Involvement: Pt would not give permission to contact anyone for collateral information.   Does Patient Have a Automotive Engineer Guardian? No data recorded Name and Contact of Legal Guardian: No data recorded If Minor and Not Living with Parent(s), Who has Custody? Pt is an adult.  Is CPS involved or ever been  involved? Never  Is APS involved or ever been involved? Never   Patient Determined To Be At Risk for Harm To Self or Others Based on Review of Patient Reported Information or Presenting Complaint? No  Method: -- (Patient made cuts to her neck and arms.)  Availability of Means: -- (She used a straight razor to cut herself.)  Intent: -- (Pt had reported to triage RN this was a sucide attempt.)  Notification Required: No need or identified person (Pt denies sny HI.)  Additional Information for Danger to Others Potential: -- (Has gotten into fights before.)  Additional Comments for Danger to Others Potential: Patient said she has gotten into fights before but not recently.  Are There Guns or Other Weapons in Your Home? No  Types of Guns/Weapons: Patient denies having a gun.  But says anybody can get one.  Are These Weapons Safely Secured?                            No  Who Could Verify You Are Able To Have These Secured: No weapon to safely secure.  Do You Have any Outstanding Charges, Pending Court Dates, Parole/Probation? None  Contacted To Inform of Risk of Harm To Self or Others: Other: Comment (No HI.)   Does Patient Present under Involuntary Commitment? No    [place] of Residence: [place]   Patient Currently Receiving the Following Services: Not Receiving Services   Determination of Need: Emergent (2 hours)   Options For Referral: ED Visit; Medication Management; Outpatient Therapy   Disposition Recommendation per psychiatric provider: There are no psychiatric contraindications to discharge at this time  Kiki DOROTHA Barge MS, LCAS, [facility], [facility] Therapeutic Triage Specialist 09/10/2024 3:56 PM

## 2024-09-10 NOTE — ED Notes (Signed)
 Pt informed her mother had came in and asked about visiting pt. Pt was asked if she wanted to visit with her mother when she arrived back during visitation hours. Pt states that she does not want to visit with anyone that she just wants to see the psych provider.

## 2024-09-10 NOTE — ED Notes (Signed)
IVC 

## 2024-09-10 NOTE — ED Provider Notes (Signed)
 [facility] Provider Note    Event Date/Time   First MD Initiated Contact with Patient 09/10/24 1503     (approximate)   History   Psychiatric Evaluation   HPI  Susan Graves is a 28 y.o. female resents to the emergency department today under IVC.  Per IVC paperwork the concern was for suicidal ideation and homicidal ideation.  The patient herself denies any SI or HI.  She says that she got into an argument with her girlfriend who took out the paperwork.  She does admit to self-harm although states that was in the past.  Denies any recent self-harm.  Denies any medical complaints.     Physical Exam   Triage Vital Signs: ED Triage Vitals  Encounter Vitals Group     BP 09/10/24 1423 97/75     Girls Systolic BP Percentile --      Girls Diastolic BP Percentile --      Boys Systolic BP Percentile --      Boys Diastolic BP Percentile --      Pulse Rate 09/10/24 1423 93     Resp 09/10/24 1423 15     Temp 09/10/24 1423 98.2 F (36.8 C)     Temp Source 09/10/24 1423 Oral     SpO2 09/10/24 1423 100 %     Weight 09/10/24 1428 171 lb (77.6 kg)     Height 09/10/24 1428 5' 6 (1.676 m)     Head Circumference --      Peak Flow --      Pain Score 09/10/24 1428 0     Pain Loc --      Pain Education --      Exclude from Growth Chart --     Most recent vital signs: Vitals:   09/10/24 1423  BP: 97/75  Pulse: 93  Resp: 15  Temp: 98.2 F (36.8 C)  SpO2: 100%   General: Awake, alert, oriented. Calm. CV:  Good peripheral perfusion.  Resp:  Normal effort.  Abd:  No distention.    ED Results / Procedures / Treatments   Labs (all labs ordered are listed, but only abnormal results are displayed) Labs Reviewed  COMPREHENSIVE METABOLIC PANEL WITH GFR - Abnormal; Notable for the following components:      Result Value   CO2 19 (*)    BUN <5 (*)    All other components within normal limits  CBC - Abnormal; Notable for the following components:    Hemoglobin 11.2 (*)    HCT 35.9 (*)    MCV 75.6 (*)    MCH 23.6 (*)    RDW 16.1 (*)    All other components within normal limits  ETHANOL  URINE DRUG SCREEN  POC URINE PREG, ED     EKG  None   RADIOLOGY None   PROCEDURES:  Critical Care performed: No   MEDICATIONS ORDERED IN ED: Medications - No data to display   IMPRESSION / MDM / ASSESSMENT AND PLAN / ED COURSE  I reviewed the triage vital signs and the nursing notes.                              Differential diagnosis includes, but is not limited to, SI, drug induced mood disorder, inappropriate utilization of IVC pathway by petitioner  Patient's presentation is most consistent with acute presentation with potential threat to life or bodily function.  Patient presents to  the emergency department today under IVC for petitioner's concern for SI, HI.  Patient herself denies any SI or HI.  States that she got into an argument with her girlfriend.  She does have history of self-harm.  Do think it would be beneficial given history to have patient be evaluated by psychiatry.  Will continue IVC at this time.  The patient has been placed in psychiatric observation due to the need to provide a safe environment for the patient while obtaining psychiatric consultation and evaluation, as well as ongoing medical and medication management to treat the patient's condition.  The patient has been placed under full IVC at this time.      FINAL CLINICAL IMPRESSION(S) / ED DIAGNOSES   Final diagnoses:  Involuntary commitment     Note:  This document was prepared using Dragon voice recognition software and may include unintentional dictation errors.    Floy Roberts, MD 09/10/24 [phone]

## 2024-09-10 NOTE — ED Notes (Signed)
 Pt states that she and her girlfriend got into an argument today. Pt states that her girlfriend called the cops and used the fact that she has harmed herself in the past against her, Pt states that last year she did cut her neck but has not attempted to harm herself since then. Pt denies thoughts of harming herself or anyone else. Pt is very pleasant and cooperative at this time.

## 2024-09-10 NOTE — ED Triage Notes (Signed)
 Pt accompanied by police with IVC paperwork that states she is a danger to self and suicidal. Pt calm during triage and cooperative. Pt states, My ex told a lie. Pt denies any active suicidal/homicidal thoughts at this time. Pt last admission was December 2024.

## 2024-09-10 NOTE — ED Notes (Signed)
Psych provider at bedside at this time. ?
